# Patient Record
Sex: Female | Born: 1950 | Race: White | Hispanic: No | Marital: Single | Smoking: Former smoker
Health system: Southern US, Community
[De-identification: ages and names within clinical notes are randomized; demographics above are authoritative.]

## PROBLEM LIST (undated history)

## (undated) DIAGNOSIS — E559 Vitamin D deficiency, unspecified: Secondary | ICD-10-CM

## (undated) DIAGNOSIS — T7840XA Allergy, unspecified, initial encounter: Secondary | ICD-10-CM

## (undated) DIAGNOSIS — C801 Malignant (primary) neoplasm, unspecified: Secondary | ICD-10-CM

## (undated) DIAGNOSIS — E119 Type 2 diabetes mellitus without complications: Secondary | ICD-10-CM

## (undated) DIAGNOSIS — I10 Essential (primary) hypertension: Secondary | ICD-10-CM

## (undated) DIAGNOSIS — Z972 Presence of dental prosthetic device (complete) (partial): Secondary | ICD-10-CM

## (undated) DIAGNOSIS — E785 Hyperlipidemia, unspecified: Secondary | ICD-10-CM

## (undated) HISTORY — DX: Allergy, unspecified, initial encounter: T78.40XA

## (undated) HISTORY — DX: Vitamin D deficiency, unspecified: E55.9

## (undated) HISTORY — PX: MOHS SURGERY: SUR867

## (undated) HISTORY — DX: Type 2 diabetes mellitus without complications: E11.9

## (undated) HISTORY — DX: Essential (primary) hypertension: I10

## (undated) HISTORY — PX: OTHER SURGICAL HISTORY: SHX169

## (undated) HISTORY — DX: Malignant (primary) neoplasm, unspecified: C80.1

## (undated) HISTORY — DX: Hyperlipidemia, unspecified: E78.5

---

## 2002-11-17 ENCOUNTER — Other Ambulatory Visit: Admission: RE | Admit: 2002-11-17 | Discharge: 2002-11-17 | Payer: Self-pay | Admitting: Family Medicine

## 2003-12-19 ENCOUNTER — Ambulatory Visit: Payer: Self-pay | Admitting: Family Medicine

## 2003-12-21 ENCOUNTER — Ambulatory Visit: Payer: Self-pay | Admitting: Family Medicine

## 2003-12-21 ENCOUNTER — Other Ambulatory Visit: Admission: RE | Admit: 2003-12-21 | Discharge: 2003-12-21 | Payer: Self-pay | Admitting: Family Medicine

## 2003-12-21 LAB — CONVERTED CEMR LAB: Pap Smear: NORMAL

## 2004-01-04 ENCOUNTER — Ambulatory Visit: Payer: Self-pay | Admitting: Family Medicine

## 2005-01-30 ENCOUNTER — Ambulatory Visit: Payer: Self-pay | Admitting: Family Medicine

## 2005-02-03 ENCOUNTER — Encounter: Payer: Self-pay | Admitting: Family Medicine

## 2005-02-03 ENCOUNTER — Ambulatory Visit: Payer: Self-pay | Admitting: Family Medicine

## 2005-02-03 ENCOUNTER — Other Ambulatory Visit: Admission: RE | Admit: 2005-02-03 | Discharge: 2005-02-03 | Payer: Self-pay | Admitting: Family Medicine

## 2005-03-10 ENCOUNTER — Ambulatory Visit: Payer: Self-pay | Admitting: Family Medicine

## 2005-03-27 ENCOUNTER — Ambulatory Visit: Payer: Self-pay | Admitting: Family Medicine

## 2005-04-02 ENCOUNTER — Ambulatory Visit: Payer: Self-pay | Admitting: Family Medicine

## 2005-05-27 ENCOUNTER — Ambulatory Visit: Payer: Self-pay | Admitting: Internal Medicine

## 2006-02-22 ENCOUNTER — Ambulatory Visit: Payer: Self-pay | Admitting: Family Medicine

## 2006-02-22 LAB — CONVERTED CEMR LAB
ALT: 22 units/L (ref 0–40)
Alkaline Phosphatase: 63 units/L (ref 39–117)
CO2: 30 meq/L (ref 19–32)
Chloride: 104 meq/L (ref 96–112)
Glucose, Bld: 113 mg/dL — ABNORMAL HIGH (ref 70–99)
HDL: 34.2 mg/dL — ABNORMAL LOW (ref >39.0)
LDL Cholesterol: 83 mg/dL (ref 0–99)
Potassium: 3.4 meq/L — ABNORMAL LOW (ref 3.5–5.1)
Sodium: 142 meq/L (ref 135–145)
VLDL: 21 mg/dL (ref 0–40)

## 2006-02-24 ENCOUNTER — Other Ambulatory Visit: Admission: RE | Admit: 2006-02-24 | Discharge: 2006-02-24 | Payer: Self-pay | Admitting: Family Medicine

## 2006-02-24 ENCOUNTER — Ambulatory Visit: Payer: Self-pay | Admitting: Family Medicine

## 2006-02-24 ENCOUNTER — Encounter: Payer: Self-pay | Admitting: Family Medicine

## 2006-03-18 ENCOUNTER — Ambulatory Visit: Payer: Self-pay | Admitting: Family Medicine

## 2006-08-04 ENCOUNTER — Encounter: Payer: Self-pay | Admitting: Family Medicine

## 2006-08-04 DIAGNOSIS — F17200 Nicotine dependence, unspecified, uncomplicated: Secondary | ICD-10-CM | POA: Insufficient documentation

## 2006-08-04 DIAGNOSIS — I10 Essential (primary) hypertension: Secondary | ICD-10-CM | POA: Insufficient documentation

## 2006-08-11 ENCOUNTER — Ambulatory Visit: Payer: Self-pay | Admitting: Family Medicine

## 2006-08-11 DIAGNOSIS — E749 Disorder of carbohydrate metabolism, unspecified: Secondary | ICD-10-CM | POA: Insufficient documentation

## 2006-08-11 LAB — CONVERTED CEMR LAB
ALT: 18 units/L (ref 0–35)
AST: 20 units/L (ref 0–37)
Albumin: 4.1 g/dL (ref 3.5–5.2)
Alkaline Phosphatase: 57 units/L (ref 39–117)
BUN: 17 mg/dL (ref 6–23)
Basophils Absolute: 0 10*3/uL (ref 0.0–0.1)
Calcium: 9.8 mg/dL (ref 8.4–10.5)
Chloride: 106 meq/L (ref 96–112)
Eosinophils Absolute: 0.2 10*3/uL (ref 0.0–0.6)
Eosinophils Relative: 2.4 % (ref 0.0–5.0)
GFR calc Af Amer: 134 mL/min
GFR calc non Af Amer: 111 mL/min
HDL: 35.7 mg/dL — ABNORMAL LOW (ref >39.0)
MCHC: 34.1 g/dL (ref 30.0–36.0)
MCV: 89.7 fL (ref 78.0–100.0)
Microalb Creat Ratio: 6.3 mg/g (ref 0.0–30.0)
Monocytes Relative: 8.6 % (ref 3.0–11.0)
Neutro Abs: 4.8 10*3/uL (ref 1.4–7.7)
Platelets: 201 10*3/uL (ref 150–400)
RBC: 4.75 M/uL (ref 3.87–5.11)
TSH: 1.44 microintl units/mL (ref 0.35–5.50)
Total CHOL/HDL Ratio: 4.5
Triglycerides: 120 mg/dL (ref 0–149)
WBC: 7.8 10*3/uL (ref 4.5–10.5)

## 2006-08-13 ENCOUNTER — Ambulatory Visit: Payer: Self-pay | Admitting: Family Medicine

## 2007-02-11 ENCOUNTER — Telehealth (INDEPENDENT_AMBULATORY_CARE_PROVIDER_SITE_OTHER): Payer: Self-pay | Admitting: *Deleted

## 2007-03-30 ENCOUNTER — Ambulatory Visit: Payer: Self-pay | Admitting: Family Medicine

## 2007-03-30 LAB — CONVERTED CEMR LAB
AST: 21 units/L (ref 0–37)
Albumin: 4.4 g/dL (ref 3.5–5.2)
Basophils Absolute: 0 10*3/uL (ref 0.0–0.1)
Bilirubin, Direct: 0.1 mg/dL (ref 0.0–0.3)
Chloride: 104 meq/L (ref 96–112)
Cholesterol: 129 mg/dL (ref 0–200)
Creatinine,U: 66.6 mg/dL
Eosinophils Absolute: 0.1 10*3/uL (ref 0.0–0.6)
Eosinophils Relative: 1 % (ref 0.0–5.0)
GFR calc Af Amer: 96 mL/min
GFR calc non Af Amer: 79 mL/min
Glucose, Bld: 101 mg/dL — ABNORMAL HIGH (ref 70–99)
HCT: 45.4 % (ref 36.0–46.0)
Lymphocytes Relative: 18.1 % (ref 12.0–46.0)
MCHC: 33.4 g/dL (ref 30.0–36.0)
MCV: 90.8 fL (ref 78.0–100.0)
Microalb Creat Ratio: 3 mg/g (ref 0.0–30.0)
Microalb, Ur: 0.2 mg/dL (ref 0.0–1.9)
Neutro Abs: 8.8 10*3/uL — ABNORMAL HIGH (ref 1.4–7.7)
Neutrophils Relative %: 74.7 % (ref 43.0–77.0)
Platelets: 188 10*3/uL (ref 150–400)
RBC: 5 M/uL (ref 3.87–5.11)
Sodium: 141 meq/L (ref 135–145)
TSH: 1.22 microintl units/mL (ref 0.35–5.50)
Total CHOL/HDL Ratio: 3.2
Triglycerides: 77 mg/dL (ref 0–149)

## 2007-04-27 ENCOUNTER — Other Ambulatory Visit: Admission: RE | Admit: 2007-04-27 | Discharge: 2007-04-27 | Payer: Self-pay | Admitting: Family Medicine

## 2007-04-27 ENCOUNTER — Ambulatory Visit: Payer: Self-pay | Admitting: Family Medicine

## 2007-04-27 ENCOUNTER — Encounter: Payer: Self-pay | Admitting: Family Medicine

## 2007-04-27 LAB — CONVERTED CEMR LAB: Pap Smear: NORMAL

## 2007-05-02 ENCOUNTER — Encounter (INDEPENDENT_AMBULATORY_CARE_PROVIDER_SITE_OTHER): Payer: Self-pay | Admitting: *Deleted

## 2007-05-09 ENCOUNTER — Encounter: Payer: Self-pay | Admitting: Family Medicine

## 2007-06-01 ENCOUNTER — Encounter: Payer: Self-pay | Admitting: Family Medicine

## 2007-06-06 ENCOUNTER — Encounter (INDEPENDENT_AMBULATORY_CARE_PROVIDER_SITE_OTHER): Payer: Self-pay | Admitting: *Deleted

## 2008-04-30 ENCOUNTER — Ambulatory Visit: Payer: Self-pay | Admitting: Family Medicine

## 2008-04-30 LAB — CONVERTED CEMR LAB
ALT: 16 units/L (ref 0–35)
AST: 20 units/L (ref 0–37)
Albumin: 4.4 g/dL (ref 3.5–5.2)
BUN: 13 mg/dL (ref 6–23)
Basophils Absolute: 0 10*3/uL (ref 0.0–0.1)
CO2: 34 meq/L — ABNORMAL HIGH (ref 19–32)
Chloride: 107 meq/L (ref 96–112)
Cholesterol: 142 mg/dL (ref 0–200)
GFR calc non Af Amer: 92.22 mL/min (ref >60)
Glucose, Bld: 104 mg/dL — ABNORMAL HIGH (ref 70–99)
HCT: 43.5 % (ref 36.0–46.0)
Hemoglobin: 15.3 g/dL — ABNORMAL HIGH (ref 12.0–15.0)
Lymphs Abs: 1.8 10*3/uL (ref 0.7–4.0)
MCHC: 35.1 g/dL (ref 30.0–36.0)
MCV: 91 fL (ref 78.0–100.0)
Microalb Creat Ratio: 5.6 mg/g (ref 0.0–30.0)
Microalb, Ur: 0.2 mg/dL (ref 0.0–1.9)
Monocytes Absolute: 0.6 10*3/uL (ref 0.1–1.0)
Monocytes Relative: 8 % (ref 3.0–12.0)
Neutro Abs: 5.1 10*3/uL (ref 1.4–7.7)
Platelets: 172 10*3/uL (ref 150.0–400.0)
Potassium: 4 meq/L (ref 3.5–5.1)
RDW: 12.4 % (ref 11.5–14.6)
Sodium: 145 meq/L (ref 135–145)
TSH: 1.62 microintl units/mL (ref 0.35–5.50)

## 2008-05-02 ENCOUNTER — Other Ambulatory Visit: Admission: RE | Admit: 2008-05-02 | Discharge: 2008-05-02 | Payer: Self-pay | Admitting: Family Medicine

## 2008-05-02 ENCOUNTER — Ambulatory Visit: Payer: Self-pay | Admitting: Family Medicine

## 2008-05-02 ENCOUNTER — Encounter: Payer: Self-pay | Admitting: Family Medicine

## 2008-05-02 LAB — CONVERTED CEMR LAB: Pap Smear: NORMAL

## 2008-05-16 ENCOUNTER — Ambulatory Visit: Payer: Self-pay | Admitting: Family Medicine

## 2008-05-16 ENCOUNTER — Encounter (INDEPENDENT_AMBULATORY_CARE_PROVIDER_SITE_OTHER): Payer: Self-pay | Admitting: *Deleted

## 2008-05-21 ENCOUNTER — Encounter (INDEPENDENT_AMBULATORY_CARE_PROVIDER_SITE_OTHER): Payer: Self-pay | Admitting: *Deleted

## 2008-06-01 ENCOUNTER — Encounter: Payer: Self-pay | Admitting: Family Medicine

## 2008-06-01 LAB — HM MAMMOGRAPHY: HM Mammogram: NORMAL

## 2008-06-06 ENCOUNTER — Encounter (INDEPENDENT_AMBULATORY_CARE_PROVIDER_SITE_OTHER): Payer: Self-pay | Admitting: *Deleted

## 2008-06-13 ENCOUNTER — Ambulatory Visit: Payer: Self-pay | Admitting: Family Medicine

## 2008-06-13 LAB — CONVERTED CEMR LAB
ALT: 18 units/L (ref 0–35)
AST: 23 units/L (ref 0–37)

## 2008-07-26 ENCOUNTER — Ambulatory Visit: Payer: Self-pay | Admitting: Family Medicine

## 2008-07-26 LAB — CONVERTED CEMR LAB
AST: 24 units/L (ref 0–37)
Cholesterol: 158 mg/dL (ref 0–200)
Triglycerides: 84 mg/dL (ref 0.0–149.0)

## 2008-08-02 ENCOUNTER — Ambulatory Visit: Payer: Self-pay | Admitting: Family Medicine

## 2009-05-02 ENCOUNTER — Ambulatory Visit: Payer: Self-pay | Admitting: Family Medicine

## 2009-05-02 LAB — CONVERTED CEMR LAB
ALT: 24 units/L (ref 0–35)
Alkaline Phosphatase: 65 units/L (ref 39–117)
Basophils Relative: 0.6 % (ref 0.0–3.0)
Bilirubin, Direct: 0 mg/dL (ref 0.0–0.3)
Calcium: 9.4 mg/dL (ref 8.4–10.5)
Creatinine, Ser: 0.6 mg/dL (ref 0.4–1.2)
Creatinine,U: 31.8 mg/dL
Eosinophils Absolute: 0.1 10*3/uL (ref 0.0–0.7)
Eosinophils Relative: 2.1 % (ref 0.0–5.0)
GFR calc non Af Amer: 109.77 mL/min (ref >60)
HDL: 35.9 mg/dL — ABNORMAL LOW (ref >39.00)
LDL Cholesterol: 86 mg/dL (ref 0–99)
Lymphocytes Relative: 27.1 % (ref 12.0–46.0)
Monocytes Relative: 7.6 % (ref 3.0–12.0)
Neutrophils Relative %: 62.6 % (ref 43.0–77.0)
RBC: 4.65 M/uL (ref 3.87–5.11)
Total CHOL/HDL Ratio: 4
Total Protein: 6.6 g/dL (ref 6.0–8.3)
Triglycerides: 115 mg/dL (ref 0.0–149.0)
WBC: 6.7 10*3/uL (ref 4.5–10.5)

## 2009-05-07 ENCOUNTER — Other Ambulatory Visit: Admission: RE | Admit: 2009-05-07 | Discharge: 2009-05-07 | Payer: Self-pay | Admitting: Family Medicine

## 2009-05-07 ENCOUNTER — Ambulatory Visit: Payer: Self-pay | Admitting: Family Medicine

## 2009-05-07 LAB — CONVERTED CEMR LAB: Pap Smear: NORMAL

## 2009-05-07 LAB — HM PAP SMEAR

## 2009-05-13 ENCOUNTER — Encounter (INDEPENDENT_AMBULATORY_CARE_PROVIDER_SITE_OTHER): Payer: Self-pay | Admitting: *Deleted

## 2009-05-13 LAB — CONVERTED CEMR LAB: Pap Smear: NEGATIVE

## 2009-05-27 ENCOUNTER — Ambulatory Visit: Payer: Self-pay | Admitting: Family Medicine

## 2009-05-27 ENCOUNTER — Encounter (INDEPENDENT_AMBULATORY_CARE_PROVIDER_SITE_OTHER): Payer: Self-pay | Admitting: *Deleted

## 2009-05-27 LAB — FECAL OCCULT BLOOD, GUAIAC: Fecal Occult Blood: NEGATIVE

## 2009-05-27 LAB — CONVERTED CEMR LAB: OCCULT 3: NEGATIVE

## 2009-06-04 ENCOUNTER — Encounter: Payer: Self-pay | Admitting: Family Medicine

## 2009-06-04 ENCOUNTER — Telehealth: Payer: Self-pay | Admitting: Family Medicine

## 2009-06-05 ENCOUNTER — Encounter (INDEPENDENT_AMBULATORY_CARE_PROVIDER_SITE_OTHER): Payer: Self-pay | Admitting: *Deleted

## 2009-09-10 ENCOUNTER — Encounter (INDEPENDENT_AMBULATORY_CARE_PROVIDER_SITE_OTHER): Payer: Self-pay | Admitting: *Deleted

## 2010-03-04 NOTE — Letter (Signed)
Summary: Results Follow up Letter  [place] at [facility]  [address] [place], [place] [address]   Phone: [phone]  Fax: [phone]    05/13/2009 MRN: [id number]  Amaurie Doom [address] [place], [place]  [address]  Dear Ms. Lampi,  The following are the results of your recent test(s):  Test         Result    Pap Smear:        Normal ___X__  Not Normal _____ Comments: Please repeat in one year ______________________________________________________ Cholesterol: LDL(Bad cholesterol):         Your goal is less than:         HDL (Good cholesterol):       Your goal is more than: Comments:  ______________________________________________________ Mammogram:        Normal _____  Not Normal _____ Comments:  ___________________________________________________________________ Hemoccult:        Normal _____  Not normal _______ Comments:    _____________________________________________________________________ Other Tests:    We routinely do not discuss normal results over the telephone.  If you desire a copy of the results, or you have any questions about this information we can discuss them at your next office visit.   Sincerely,     Laurita Quint, MD

## 2010-03-04 NOTE — Letter (Signed)
Summary: Results Follow up Letter  [place] at [facility]  [address] [place], [place] [address]   Phone: [phone]  Fax: [phone]    06/05/2009 MRN: [id number]  Zahraa Rester [address] [place], [place]  [address]  Dear Ms. Brisky,  The following are the results of your recent test(s):  Test         Result    Pap Smear:        Normal _____  Not Normal _____ Comments: ______________________________________________________ Cholesterol: LDL(Bad cholesterol):         Your goal is less than:         HDL (Good cholesterol):       Your goal is more than: Comments:  ______________________________________________________ Mammogram:        Normal __X___  Not Normal _____ Comments: Please repeat in one year.  ___________________________________________________________________ Hemoccult:        Normal _____  Not normal _______ Comments:    _____________________________________________________________________ Other Tests:    We routinely do not discuss normal results over the telephone.  If you desire a copy of the results, or you have any questions about this information we can discuss them at your next office visit.   Sincerely,     Laurita Quint, MD

## 2010-03-04 NOTE — Letter (Signed)
Summary: Nadara Eaton letter  [place] at [facility]  [address] [place], [place] [address]   Phone: [phone]  Fax: [phone]       09/10/2009 MRN: [id number]  Lakeisha Hansson [address] [place], [place]  [address]  Dear Ms. Loree [facility] [place] announce the retirement of Arta Silence, M.D., from full-time practice at the [facility] office effective August 01, 2009 and his plans of returning part-time.  It is important to Dr. Hetty Ely and to our practice that you understand that [facility] has seven physicians in our office for your health care needs.  We will continue to offer the same exceptional care that you have today.    Dr. Hetty Ely has spoken to many of you about his plans for retirement and returning part-time in the fall.   We will continue to work with you through the transition to schedule appointments for you in the office and meet the high standards that [facility] is committed to.   Again, it is with great pleasure that we share the news that Dr. Hetty Ely will return to [facility] in October of 2011 with a reduced schedule.    If you have any questions, or would like to request an appointment with one of our physicians, please call us at [phone] and press the option for Scheduling an appointment.  We take pleasure in providing you with excellent patient care and look forward to seeing you at your next office visit.  Our [facility] Physicians are:  Tillman Abide, M.D. Laurita Quint, M.D. Roxy Manns, M.D. Kerby Nora, M.D. Hannah Beat, M.D. Ruthe Mannan, M.D. We proudly welcomed Raechel Ache, M.D. and Eustaquio Boyden, M.D. to the practice in July/August 2011.  Sincerely,   [facility]

## 2010-03-04 NOTE — Letter (Signed)
Summary: Results Follow up Letter  [place] at [facility]  [address] [place], [place] [address]   Phone: [phone]  Fax: [phone]    05/27/2009 MRN: [id number]  Donalee Broadnax [address] [place], [place]  [address]  Dear Ms. Branam,  The following are the results of your recent test(s):  Test         Result    Pap Smear:        Normal _____  Not Normal _____ Comments: ______________________________________________________ Cholesterol: LDL(Bad cholesterol):         Your goal is less than:         HDL (Good cholesterol):       Your goal is more than: Comments:  ______________________________________________________ Mammogram:        Normal _____  Not Normal _____ Comments:  ___________________________________________________________________ Hemoccult:        Normal _X____  Not normal _______ Comments: Please repeat in one year.   _____________________________________________________________________ Other Tests:    We routinely do not discuss normal results over the telephone.  If you desire a copy of the results, or you have any questions about this information we can discuss them at your next office visit.   Sincerely,     Laurita Quint, MD

## 2010-03-04 NOTE — Assessment & Plan Note (Signed)
Summary: CPX / LFW   Vital Signs:  Patient profile:   60 year old female Weight:      152.25 pounds BMI:     27.50 Temp:     99.0 degrees F oral Pulse rate:   80 / minute Pulse rhythm:   regular BP sitting:   120 / 76  (left arm) Cuff size:   regular  Vitals Entered By: Sydell Axon LPN (May 07, 1608 8:38 AM) CC: 30 Minute checkup, hemoccult cards given to patient   History of Present Illness: Pt here for Comp Exam...has restarted smoking twice. Is continuing to try....has gained weight.  She feels well except right shoulder is popping and cracking.   Preventive Screening-Counseling & Management  Alcohol-Tobacco     Alcohol drinks/day: 0     Smoking Status: current, but had quit and gained weight.     Smoking Cessation Counseling: no     Packs/Day: <0.25     Year Started: 1974     Year Quit: Quit and restarted x 2 lately.     Passive Smoke Exposure: no  Caffeine-Diet-Exercise     Caffeine use/day: 3     Does Patient Exercise: yes     Type of exercise: walks, yardwork, ex machine     Times/week: 4  Problems Prior to Update: 1)  Special Screening Malig Neoplasms Other Sites  (ICD-V76.49) 2)  Iliotibial Band Syndrome Right  (ICD-728.89) 3)  Joint Crepitus, Knee, Right  (ICD-719.66) 4)  Other Screening Mammogram  (ICD-V76.12) 5)  Health Maintenance Exam  (ICD-V70.0) 6)  Disorder, Carbohydrate Metabolism Nos  (ICD-271.9) 7)  Hyperglycemia, 100  (ICD-790.6) 8)  Nicotine Addiction  (ICD-305.1) 9)  Hypercholesterolemia, 313/ Ldl 262 / Hdl 22  (ICD-272.0) 10)  Hypertension  (ICD-401.9)  Medications Prior to Update: 1)  Simvastatin 80 Mg Tabs (Simvastatin) .... One Tab At Night 2)  Maxzide-25 37.5-25 Mg Tabs (Triamterene-Hctz) .... Take 1 Tablet By Mouth Once A Day 3)  Potassium Chloride Cr 10 Meq Tbcr (Potassium Chloride) .... Take 1 Tablet By Mouth Every Morning 4)  One-Daily Multivitamins   Tabs (Multiple Vitamin) .... Take One By Mouth Daily  Allergies: 1)  !  Polysporin  Past History:  Past Medical History: Last updated: 08/04/2006 Hypertension  Past Surgical History: Last updated: 08/04/2006 NSVD x 3 Last 1995  Family History: Last updated: 2007-05-05 Father: Died at age 6 of heart attack Mother: Died at age 41 of breast cancer with hypertension Siblings: One brother who died of diabetes and heart attack at the age of 10, one who died from a motor vehicle accident at 40 and one dec Motorcycle accdt age of 80 CV, Father Mi, Brother MI HBP, Mother DM, Brother, MGM Depression father ETOH, father  Social History: Last updated: 05/02/2008 Marital Status: Divorced, living with partner of 15 years. Children: Daughter  Occupation: Dispensing optician at [employer]  Risk Factors: Alcohol Use: 0 (05/07/2009) Caffeine Use: 3 (05/07/2009) Exercise: yes (05/07/2009)  Risk Factors: Smoking Status: current, but had quit and gained weight. (05/07/2009) Packs/Day: <0.25 (05/07/2009) Passive Smoke Exposure: no (05/07/2009)  Social History: Packs/Day:  <0.25  Review of Systems General:  Denies chills, fatigue, fever, sweats, weakness, and weight loss. Eyes:  Denies blurring, discharge, and eye pain; saw eye doctore for crusting in eye and given antibiotic. ENT:  Denies decreased hearing, earache, and ringing in ears. CV:  Complains of swelling of hands; denies chest pain or discomfort, fainting, fatigue, near fainting, palpitations, shortness of breath  with exertion, and swelling of feet; rare. Resp:  Denies cough, shortness of breath, and wheezing. GI:  Complains of excessive appetite; denies abdominal pain, bloody stools, change in bowel habits, constipation, dark tarry stools, diarrhea, indigestion, loss of appetite, nausea, vomiting, vomiting blood, and yellowish skin color; with stopping smoking. GU:  Denies dysuria, nocturia, and urinary frequency. MS:  Complains of joint pain; denies low back pain, muscle aches, cramps, and  stiffness; shoulder crepitus and occas knee probs.. Derm:  Complains of dryness; denies itching and rash; legs. Neuro:  Denies memory loss, numbness, poor balance, tingling, and tremors.  Physical Exam  General:  Well-developed,well-nourished,in no acute distress; alert,appropriate and cooperative throughout examination Head:  Normocephalic and atraumatic without obvious abnormalities. No apparent alopecia or  Sinuses NT. Eyes:  Conjunctiva clear bilaterally.  Ears:  External ear exam shows no significant lesions or deformities.  Otoscopic examination reveals clear canals, tympanic membranes are intact bilaterally without bulging, retraction, inflammation or discharge. Hearing is grossly normal bilaterally. Nose:  External nasal examination shows no deformity or inflammation. Nasal mucosa are pink and moist without lesions or exudates. Mouth:  Oral mucosa and oropharynx without lesions or exudates.  Teeth in good repair. Neck:  No deformities, masses, or tenderness noted. Chest Wall:  No deformities, masses, or tenderness noted. Breasts:  No mass, nodules, thickening, tenderness, bulging, retraction, inflamation, nipple discharge or skin changes noted.   Lungs:  Normal respiratory effort, chest expands symmetrically. Lungs are clear to auscultation, no crackles or wheezes. Heart:  Normal rate and regular rhythm. S1 and S2 normal without gallop, murmur, click, rub or other extra sounds. Abdomen:  Bowel sounds positive,abdomen soft and non-tender without masses, organomegaly or hernias noted. Rectal:  No external abnormalities noted. Normal sphincter tone. No rectal masses or tenderness. Gneg. Genitalia:  Pelvic Exam:        External: normal female genitalia without lesions or masses        Vagina: normal without lesions or masses        Cervix: normal without lesions or masses        Adnexa: normal bimanual exam without masses or fullness        Uterus: normal by palpation        Pap smear:  performed Msk:  No deformity or scoliosis noted of thoracic or lumbar spine.   Pulses:  R and L carotid,radial,femoral,dorsalis pedis and posterior tibial pulses are full and equal bilaterally Extremities:  No clubbing, cyanosis, edema, or deformity noted with normal full range of motion of all joints.   Neurologic:  No cranial nerve deficits noted. Station and gait are normal. Sensory, motor and coordinative functions appear intact. Skin:  Intact without suspicious lesions or rashes Cervical Nodes:  No lymphadenopathy noted Axillary Nodes:  No palpable lymphadenopathy Inguinal Nodes:  No significant adenopathy Psych:  Cognition and judgment appear intact. Alert and cooperative with normal attention span and concentration. No apparent delusions, illusions, hallucinations   Impression & Recommendations:  Problem # 1:  HEALTH MAINTENANCE EXAM (ICD-V70.0) Assessment Comment Only Discussed immun, she is UTD. Needs mammo...last this time last year....scheduled 5 May.  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 Cont to watch sweets and carb intake.  Problem # 3:  NICOTINE ADDICTION (ICD-305.1) Assessment: Deteriorated Is back smoking some. Is able to stop fairly easily for a time...discussed goal to permanently quit. Diet ans exercise altho not easy are the way to watch diet.  Problem # 4:  HYPERCHOLESTEROLEMIA, 313/ LDL 262 / HDL 22 (ICD-272.0) Assessment: Unchanged Decent nos. Try to exercise more to elevate HDL.  Her updated medication list for this problem includes:    Simvastatin 80 Mg Tabs (Simvastatin) ..... One tab at night  Labs Reviewed: SGOT: 22 (05/02/2009)   SGPT: 24 (05/02/2009)   HDL:35.90 (05/02/2009), 38.30 (07/26/2008)  LDL:86 (05/02/2009), 103 (36/64/4034)  Chol:145 (05/02/2009), 158 (07/26/2008)  Trig:115.0 (05/02/2009), 84.0 (07/26/2008)  Problem # 5:  HYPERTENSION (ICD-401.9) Assessment:  Improved Better than last time. Looks good, cont curr meds. Her updated medication list for this problem includes:    Maxzide-25 37.5-25 Mg Tabs (Triamterene-hctz) .Marland Kitchen... Take 1 tablet by mouth once a day  BP today: 120/76 Prior BP: 140/60 (08/02/2008)  Labs Reviewed: K+: 3.6 (05/02/2009) Creat: : 0.6 (05/02/2009)   Chol: 145 (05/02/2009)   HDL: 35.90 (05/02/2009)   LDL: 86 (05/02/2009)   TG: 115.0 (05/02/2009)  Complete Medication List: 1)  Simvastatin 80 Mg Tabs (Simvastatin) .... One tab at night 2)  Maxzide-25 37.5-25 Mg Tabs (Triamterene-hctz) .... Take 1 tablet by mouth once a day 3)  Potassium Chloride Cr 10 Meq Tbcr (Potassium chloride) .... Take 1 tablet by mouth every morning 4)  One-daily Multivitamins Tabs (Multiple vitamin) .... Take one by mouth daily   Patient Instructions: 1)  RTC next year for repeat exam.  Current Allergies (reviewed today): ! POLYSPORIN

## 2010-03-04 NOTE — Progress Notes (Signed)
Summary: needs order for mammogram  Phone Note From Other Clinic   Caller: [facility]  [phone] Summary of Call: [facility] for her annual mammogram and they dont have an order.  They are going to do the mammogram but we need to send the order. Initial call taken by: Lowella Petties CMA,  Jun 04, 2009 8:47 AM  Follow-up for Phone Call        Order faxed. Follow-up by: Carlton Adam,  Jun 04, 2009 3:48 PM

## 2010-03-25 ENCOUNTER — Encounter: Payer: Self-pay | Admitting: Family Medicine

## 2010-03-25 DIAGNOSIS — I10 Essential (primary) hypertension: Secondary | ICD-10-CM

## 2010-05-08 ENCOUNTER — Other Ambulatory Visit: Payer: Self-pay | Admitting: Family Medicine

## 2010-05-08 DIAGNOSIS — F17200 Nicotine dependence, unspecified, uncomplicated: Secondary | ICD-10-CM

## 2010-05-08 DIAGNOSIS — I10 Essential (primary) hypertension: Secondary | ICD-10-CM

## 2010-05-08 DIAGNOSIS — E786 Lipoprotein deficiency: Secondary | ICD-10-CM

## 2010-05-08 DIAGNOSIS — E749 Disorder of carbohydrate metabolism, unspecified: Secondary | ICD-10-CM

## 2010-05-12 ENCOUNTER — Ambulatory Visit (INDEPENDENT_AMBULATORY_CARE_PROVIDER_SITE_OTHER): Payer: BC Managed Care – PPO | Admitting: Family Medicine

## 2010-05-12 ENCOUNTER — Encounter: Payer: Self-pay | Admitting: Family Medicine

## 2010-05-12 ENCOUNTER — Other Ambulatory Visit (INDEPENDENT_AMBULATORY_CARE_PROVIDER_SITE_OTHER): Payer: BC Managed Care – PPO | Admitting: Family Medicine

## 2010-05-12 DIAGNOSIS — Z0000 Encounter for general adult medical examination without abnormal findings: Secondary | ICD-10-CM

## 2010-05-12 DIAGNOSIS — F17200 Nicotine dependence, unspecified, uncomplicated: Secondary | ICD-10-CM

## 2010-05-12 DIAGNOSIS — Z1211 Encounter for screening for malignant neoplasm of colon: Secondary | ICD-10-CM | POA: Insufficient documentation

## 2010-05-12 DIAGNOSIS — E7800 Pure hypercholesterolemia, unspecified: Secondary | ICD-10-CM

## 2010-05-12 DIAGNOSIS — I10 Essential (primary) hypertension: Secondary | ICD-10-CM

## 2010-05-12 DIAGNOSIS — E749 Disorder of carbohydrate metabolism, unspecified: Secondary | ICD-10-CM

## 2010-05-12 DIAGNOSIS — E786 Lipoprotein deficiency: Secondary | ICD-10-CM

## 2010-05-12 LAB — HEPATIC FUNCTION PANEL
Alkaline Phosphatase: 59 U/L (ref 39–117)
Bilirubin, Direct: 0.1 mg/dL (ref 0.0–0.3)
Total Protein: 6.4 g/dL (ref 6.0–8.3)

## 2010-05-12 LAB — BASIC METABOLIC PANEL
CO2: 29 mEq/L (ref 19–32)
Calcium: 9.4 mg/dL (ref 8.4–10.5)
Creatinine, Ser: 0.8 mg/dL (ref 0.4–1.2)
GFR: 84.54 mL/min (ref >60.00)
Sodium: 140 mEq/L (ref 135–145)

## 2010-05-12 LAB — CBC WITH DIFFERENTIAL/PLATELET
Basophils Absolute: 0 10*3/uL (ref 0.0–0.1)
Basophils Relative: 0.5 % (ref 0.0–3.0)
HCT: 42.4 % (ref 36.0–46.0)
Hemoglobin: 14.7 g/dL (ref 12.0–15.0)
Lymphs Abs: 1.8 10*3/uL (ref 0.7–4.0)
MCHC: 34.7 g/dL (ref 30.0–36.0)
Monocytes Relative: 9.5 % (ref 3.0–12.0)
Neutro Abs: 4.8 10*3/uL (ref 1.4–7.7)
RDW: 13.8 % (ref 11.5–14.6)

## 2010-05-12 LAB — LIPID PANEL
HDL: 39.3 mg/dL (ref >39.00)
Total CHOL/HDL Ratio: 5
Triglycerides: 112 mg/dL (ref 0.0–149.0)
VLDL: 22.4 mg/dL (ref 0.0–40.0)

## 2010-05-12 LAB — MICROALBUMIN / CREATININE URINE RATIO: Microalb Creat Ratio: 0.5 mg/g (ref 0.0–30.0)

## 2010-05-12 NOTE — Assessment & Plan Note (Signed)
D/w pt re: meds/diet/weight.  See notes on labs.

## 2010-05-12 NOTE — Assessment & Plan Note (Signed)
D/w pt.  She isn't ready to quit yet but is getting there.  She'll let me know when she needs help.

## 2010-05-12 NOTE — Assessment & Plan Note (Signed)
See pending labs.  No change in meds today.  D/w pt about exercise and diet.

## 2010-05-12 NOTE — Assessment & Plan Note (Addendum)
Healthy habits d/w pt.  Flu shot at work.  Tdap up to date.  She'll call about mammogram.  Pap not indicated today.  D/w pt about smoking/diet/exercise.

## 2010-05-12 NOTE — Patient Instructions (Signed)
Work on stopping smoking.  Keep exercising and working on your diet. You can get your results through our phone system.  Follow the instructions on the blue card.  I'll send your meds to [facility] after I see your labs.  Take care.

## 2010-05-12 NOTE — Progress Notes (Addendum)
CPE- See plan.  Routine anticipatory guidance given to patient.  See health maintenance.  Hypertension:    Using medication without problems or lightheadedness: yes Chest pain with exertion:no Edema:no Short of breath:no Average home BPs: not checked Other issues:no Walking for exercise.    Elevated Cholesterol: Using medications without problems:yes Muscle aches: no Other complaints:no  PMH and SH reviewed  Meds, vitals, and allergies reviewed.   ROS: See HPI.  Otherwise negative.    GEN: nad, alert and oriented HEENT: mucous membranes moist NECK: supple w/o LA CV: rrr. PULM: ctab, no inc wob ABD: soft, +bs EXT: no edema SKIN: no acute rash Breast exam: No mass, nodules, thickening, tenderness, bulging, retraction, inflamation, nipple discharge or skin changes noted.  No axillary or clavicular LA.  Chaperoned exam.

## 2010-05-12 NOTE — Assessment & Plan Note (Signed)
D/w patient re:options for colon cancer screening, including IFOB vs. colonoscopy.  Risks and benefits of both were discussed and patient voiced understanding.  Pt elects for:IFOB  

## 2010-05-14 ENCOUNTER — Telehealth: Payer: Self-pay | Admitting: *Deleted

## 2010-05-14 MED ORDER — SIMVASTATIN 40 MG PO TABS: 40.0000 mg | ORAL_TABLET | Freq: Every day | ORAL | Status: DC

## 2010-05-14 MED ORDER — TRIAMTERENE-HCTZ 37.5-25 MG PO TABS: 1.0000 | ORAL_TABLET | Freq: Every day | ORAL | Status: DC

## 2010-05-14 MED ORDER — POTASSIUM CHLORIDE 10 MEQ PO TBCR: 10.0000 meq | EXTENDED_RELEASE_TABLET | Freq: Every day | ORAL | Status: DC

## 2010-05-14 NOTE — Telephone Encounter (Signed)
Left message on VM at work, personalized VM.

## 2010-05-14 NOTE — Progress Notes (Signed)
Addended by: Raechel Ache on: 05/14/2010 12:37 PM   Modules accepted: Orders

## 2010-05-14 NOTE — Telephone Encounter (Signed)
Message copied by Delilah Shan on Wed May 14, 2010  2:29 PM ------      Message from: Raechel Ache      Created: Wed May 14, 2010 12:38 PM       Please call pt.  Labs are okay except for mild inc in glucose. I wouldn't change meds except for the simvastatin, which was decreased to 40mg  a day.  I sent the meds to [facility] pharmacy.  I don't have the actual labs to sign off yet, because they may have been routed to [facility].  Rec cont meds and continue to work on healthy diet/exercise.  Thanks.             ----- Message -----         From: Joaquim Nam, MD         Sent: 05/12/2010   9:15 AM           To: Joaquim Nam, MD            Check labs and then send the rxs to [facility] (check doses- dec the simvastatin).

## 2010-05-27 ENCOUNTER — Other Ambulatory Visit: Payer: BC Managed Care – PPO

## 2010-05-27 ENCOUNTER — Other Ambulatory Visit (INDEPENDENT_AMBULATORY_CARE_PROVIDER_SITE_OTHER): Payer: BC Managed Care – PPO | Admitting: Family Medicine

## 2010-05-27 DIAGNOSIS — Z1211 Encounter for screening for malignant neoplasm of colon: Secondary | ICD-10-CM

## 2010-05-28 ENCOUNTER — Encounter: Payer: Self-pay | Admitting: *Deleted

## 2010-07-07 ENCOUNTER — Encounter: Payer: Self-pay | Admitting: Family Medicine

## 2011-05-13 ENCOUNTER — Telehealth: Payer: Self-pay | Admitting: Family Medicine

## 2011-05-13 DIAGNOSIS — I10 Essential (primary) hypertension: Secondary | ICD-10-CM

## 2011-05-13 NOTE — Telephone Encounter (Signed)
Orders only

## 2011-05-14 ENCOUNTER — Encounter: Payer: Self-pay | Admitting: *Deleted

## 2011-05-14 ENCOUNTER — Ambulatory Visit (INDEPENDENT_AMBULATORY_CARE_PROVIDER_SITE_OTHER): Payer: BC Managed Care – PPO | Admitting: Family Medicine

## 2011-05-14 ENCOUNTER — Encounter: Payer: Self-pay | Admitting: Family Medicine

## 2011-05-14 ENCOUNTER — Other Ambulatory Visit (INDEPENDENT_AMBULATORY_CARE_PROVIDER_SITE_OTHER): Payer: BC Managed Care – PPO

## 2011-05-14 VITALS — BP 126/68 | HR 65 | Temp 98.1°F | Wt 156.8 lb

## 2011-05-14 DIAGNOSIS — I10 Essential (primary) hypertension: Secondary | ICD-10-CM

## 2011-05-14 DIAGNOSIS — Z1211 Encounter for screening for malignant neoplasm of colon: Secondary | ICD-10-CM

## 2011-05-14 DIAGNOSIS — Z0000 Encounter for general adult medical examination without abnormal findings: Secondary | ICD-10-CM

## 2011-05-14 DIAGNOSIS — F17200 Nicotine dependence, unspecified, uncomplicated: Secondary | ICD-10-CM

## 2011-05-14 DIAGNOSIS — E7800 Pure hypercholesterolemia, unspecified: Secondary | ICD-10-CM

## 2011-05-14 LAB — COMPREHENSIVE METABOLIC PANEL
ALT: 30 U/L (ref 0–35)
CO2: 29 mEq/L (ref 19–32)
Chloride: 104 mEq/L (ref 96–112)
GFR: 101.16 mL/min (ref >60.00)
Sodium: 142 mEq/L (ref 135–145)
Total Bilirubin: 0.4 mg/dL (ref 0.3–1.2)
Total Protein: 7.1 g/dL (ref 6.0–8.3)

## 2011-05-14 LAB — LIPID PANEL
HDL: 40.1 mg/dL (ref >39.00)
Total CHOL/HDL Ratio: 5

## 2011-05-14 MED ORDER — TRIAMTERENE-HCTZ 37.5-25 MG PO TABS: 1.0000 | ORAL_TABLET | Freq: Every day | ORAL | Status: DC

## 2011-05-14 MED ORDER — SIMVASTATIN 40 MG PO TABS: 40.0000 mg | ORAL_TABLET | Freq: Every day | ORAL | Status: DC

## 2011-05-14 MED ORDER — POTASSIUM CHLORIDE ER 10 MEQ PO TBCR: 10.0000 meq | EXTENDED_RELEASE_TABLET | Freq: Every day | ORAL | Status: DC

## 2011-05-14 NOTE — Assessment & Plan Note (Signed)
Routine anticipatory guidance given to patient. See health maintenance.  She's working on stopping smoking, using the E cigarette.  Td due in 2014 Flu shot done at work Colon cancer screening.  D/w patient [id number] for colon cancer screening, including IFOB vs. colonoscopy.  Risks and benefits of both were discussed and patient voiced understanding.  Pt elects for: IFOB.   Mammogram due late 5/13.  She'll call about that.   Pap smear.  Last done 2011.  Due 2014.   She has a living will.

## 2011-05-14 NOTE — Patient Instructions (Addendum)
Go the lab on the way out.   Call about the mammogram and let us know if you need extra papers/orders.   We'll contact you with your lab report. Take care.  Glad to see you.

## 2011-05-14 NOTE — Progress Notes (Signed)
Addended by: Lars Mage on: 05/14/2011 02:09 PM   Modules accepted: Orders

## 2011-05-14 NOTE — Progress Notes (Signed)
CPE- See plan. Routine anticipatory guidance given to patient. See health maintenance.  She's working on stopping smoking, using the E cigarette.  Td due in 2014 Flu shot done at work Colon cancer screening.  D/w patient [id number] for colon cancer screening, including IFOB vs. colonoscopy.  Risks and benefits of both were discussed and patient voiced understanding.  Pt elects for: IFOB.   Mammogram due late 5/13.  She'll call about that.   Pap smear.  Last done 2011.  Due 2014.   She has a living will.   Hypertension:  Using medication without problems or lightheadedness: yes  Chest pain with exertion:no  Edema:no  Short of breath:no  Average home BPs: occ, similar to today.   Other issues:no  Walking for exercise prev, she'll start back soon.  Las are pending.    Elevated Cholesterol:  Using medications without problems:yes  Muscle aches: no  Other complaints:no   PMH and SH reviewed   Meds, vitals, and allergies reviewed.   ROS: See HPI. Otherwise negative.   GEN: nad, alert and oriented  HEENT: mucous membranes moist  NECK: supple w/o LA  CV: rrr.  PULM: ctab, no inc wob  ABD: soft, +bs  EXT: no edema  SKIN: no acute rash  Breast exam: No mass, nodules, thickening, tenderness, bulging, retraction, inflamation, nipple discharge or skin changes noted. No axillary or clavicular LA. Chaperoned exam.

## 2011-05-14 NOTE — Assessment & Plan Note (Signed)
Cont current meds, labs pending.  D/w pt about lifestyle interventions.   

## 2011-05-14 NOTE — Assessment & Plan Note (Signed)
She's working on cessation.  D/w pt.

## 2011-05-14 NOTE — Assessment & Plan Note (Signed)
Cont current meds, labs pending.  D/w pt about lifestyle interventions.

## 2011-05-19 ENCOUNTER — Encounter: Payer: BC Managed Care – PPO | Admitting: Family Medicine

## 2011-05-20 ENCOUNTER — Other Ambulatory Visit: Payer: BC Managed Care – PPO

## 2011-05-20 DIAGNOSIS — Z1211 Encounter for screening for malignant neoplasm of colon: Secondary | ICD-10-CM

## 2011-05-21 ENCOUNTER — Encounter: Payer: Self-pay | Admitting: *Deleted

## 2011-05-21 DIAGNOSIS — Z1289 Encounter for screening for malignant neoplasm of other sites: Secondary | ICD-10-CM

## 2011-06-22 ENCOUNTER — Encounter: Payer: Self-pay | Admitting: *Deleted

## 2011-06-22 ENCOUNTER — Encounter: Payer: Self-pay | Admitting: Family Medicine

## 2012-06-03 ENCOUNTER — Encounter: Payer: Self-pay | Admitting: Family Medicine

## 2012-06-03 ENCOUNTER — Ambulatory Visit (INDEPENDENT_AMBULATORY_CARE_PROVIDER_SITE_OTHER): Payer: BC Managed Care – PPO | Admitting: Family Medicine

## 2012-06-03 VITALS — BP 140/70 | HR 69 | Temp 98.4°F | Ht 63.0 in | Wt 157.5 lb

## 2012-06-03 DIAGNOSIS — Z23 Encounter for immunization: Secondary | ICD-10-CM

## 2012-06-03 DIAGNOSIS — F17200 Nicotine dependence, unspecified, uncomplicated: Secondary | ICD-10-CM

## 2012-06-03 DIAGNOSIS — Z1211 Encounter for screening for malignant neoplasm of colon: Secondary | ICD-10-CM

## 2012-06-03 DIAGNOSIS — I10 Essential (primary) hypertension: Secondary | ICD-10-CM

## 2012-06-03 DIAGNOSIS — Z1231 Encounter for screening mammogram for malignant neoplasm of breast: Secondary | ICD-10-CM

## 2012-06-03 DIAGNOSIS — E7800 Pure hypercholesterolemia, unspecified: Secondary | ICD-10-CM

## 2012-06-03 DIAGNOSIS — Z0000 Encounter for general adult medical examination without abnormal findings: Secondary | ICD-10-CM

## 2012-06-03 MED ORDER — SIMVASTATIN 40 MG PO TABS: 40.0000 mg | ORAL_TABLET | Freq: Every day | ORAL | Status: DC

## 2012-06-03 MED ORDER — POTASSIUM CHLORIDE ER 10 MEQ PO TBCR: 10.0000 meq | EXTENDED_RELEASE_TABLET | Freq: Every day | ORAL | Status: DC

## 2012-06-03 MED ORDER — TRIAMTERENE-HCTZ 37.5-25 MG PO TABS: 1.0000 | ORAL_TABLET | Freq: Every day | ORAL | Status: DC

## 2012-06-03 NOTE — Assessment & Plan Note (Signed)
Encouraged taper and then cessation. 

## 2012-06-03 NOTE — Patient Instructions (Addendum)
Go to the lab on the way out.  We'll contact you with your lab report. See Shirlee Limerick about your referral before you leave today.  Check with your insurance to see if they will cover the shingles shot after you turn 62 (see if it is cheaper here or at the pharmacy). I would get a flu shot each fall.   Take care.  Glad to see you.

## 2012-06-03 NOTE — Assessment & Plan Note (Signed)
Routine anticipatory guidance given to patient.  See health maintenance. D/w pt about smoking. She is due to become a grandmother in June.  Tetanus due Flu shot done yearly Shingles shot discussed, due at 62.   Mammogram due for later in the month.   DXA not due yet.  Pap smear last done in 2011. Never had an abnormal. D/w pt about options.  No discharge.  We opted to defer until 2015.   Breast exam done today.  D/w patient [facility] for colon cancer screening, including IFOB vs. colonoscopy.  Risks and benefits of both were discussed and patient voiced understanding.  Pt elects for: IFOB.  Occ/rare stress incontinence. She has started Kegel exercises.   She has a living will.  [name] is designated if incapacitated.

## 2012-06-03 NOTE — Assessment & Plan Note (Signed)
Controlled, continue current meds.  See notes on labs.  D/w pt about diet and exercise.

## 2012-06-03 NOTE — Assessment & Plan Note (Signed)
Continue current meds.  See notes on labs.  D/w pt about diet and exercise.

## 2012-06-03 NOTE — Progress Notes (Signed)
CPE- See plan.  Routine anticipatory guidance given to patient.  See health maintenance. D/w pt about smoking. She is due to become a grandmother in June.  Tetanus due Flu shot done yearly Shingles shot discussed, due at 62.   Mammogram due for later in the month.   DXA not due yet.  Pap smear last done in 2011. Never had an abnormal. D/w pt about options.  No discharge.  We opted to defer until 2015.   Breast exam done today.  D/w patient [id number] for colon cancer screening, including IFOB vs. colonoscopy.  Risks and benefits of both were discussed and patient voiced understanding.  Pt elects for: IFOB.  Occ/rare stress incontinence. She has started Kegel exercises.   She has a living will.  [name] is designated if incapacitated.   Hypertension:    Using medication without problems or lightheadedness: yes Chest pain with exertion:no Edema:no Short of breath:no  Elevated Cholesterol: Using medications without problems:yes Muscle aches: no Diet compliance:yes Exercise:yes  PMH and SH reviewed  Meds, vitals, and allergies reviewed.   ROS: See HPI.  Otherwise negative.    GEN: nad, alert and oriented HEENT: mucous membranes moist NECK: supple w/o LA CV: rrr. PULM: ctab, no inc wob ABD: soft, +bs EXT: no edema SKIN: no acute rash Breast exam: No mass, nodules, thickening, tenderness, bulging, retraction, inflamation, nipple discharge or skin changes noted.  No axillary or clavicular LA.  Chaperoned exam.

## 2012-06-06 LAB — COMPREHENSIVE METABOLIC PANEL
ALT: 20 U/L (ref 0–35)
CO2: 31 mEq/L (ref 19–32)
Calcium: 9.1 mg/dL (ref 8.4–10.5)
Chloride: 101 mEq/L (ref 96–112)
Creatinine, Ser: 0.6 mg/dL (ref 0.4–1.2)
GFR: 100.79 mL/min (ref >60.00)
Glucose, Bld: 103 mg/dL — ABNORMAL HIGH (ref 70–99)
Total Bilirubin: 0.6 mg/dL (ref 0.3–1.2)

## 2012-06-06 LAB — LIPID PANEL
Cholesterol: 194 mg/dL (ref 0–200)
HDL: 35 mg/dL — ABNORMAL LOW (ref >39.00)
VLDL: 29.6 mg/dL (ref 0.0–40.0)

## 2012-06-09 ENCOUNTER — Encounter: Payer: Self-pay | Admitting: *Deleted

## 2012-06-16 ENCOUNTER — Other Ambulatory Visit (INDEPENDENT_AMBULATORY_CARE_PROVIDER_SITE_OTHER): Payer: BC Managed Care – PPO

## 2012-06-16 DIAGNOSIS — Z1211 Encounter for screening for malignant neoplasm of colon: Secondary | ICD-10-CM

## 2012-06-16 LAB — FECAL OCCULT BLOOD, IMMUNOCHEMICAL: Fecal Occult Bld: NEGATIVE

## 2012-06-17 ENCOUNTER — Encounter: Payer: Self-pay | Admitting: *Deleted

## 2012-06-24 ENCOUNTER — Encounter: Payer: Self-pay | Admitting: Family Medicine

## 2012-06-24 ENCOUNTER — Encounter: Payer: Self-pay | Admitting: *Deleted

## 2012-07-01 ENCOUNTER — Encounter: Payer: Self-pay | Admitting: Family Medicine

## 2012-07-05 ENCOUNTER — Encounter: Payer: Self-pay | Admitting: *Deleted

## 2013-02-27 ENCOUNTER — Encounter: Payer: Self-pay | Admitting: Family Medicine

## 2013-02-27 ENCOUNTER — Ambulatory Visit (INDEPENDENT_AMBULATORY_CARE_PROVIDER_SITE_OTHER): Payer: BC Managed Care – PPO | Admitting: Family Medicine

## 2013-02-27 VITALS — BP 142/76 | HR 86 | Temp 98.7°F | Wt 159.2 lb

## 2013-02-27 DIAGNOSIS — IMO0002 Reserved for concepts with insufficient information to code with codable children: Secondary | ICD-10-CM

## 2013-02-27 MED ORDER — CEPHALEXIN 500 MG PO CAPS: 500.0000 mg | ORAL_CAPSULE | Freq: Four times a day (QID) | ORAL | Status: DC

## 2013-02-27 NOTE — Patient Instructions (Signed)
Soak your foot a few times today in warm salt water.   Start the antibiotics.  Elevate your foot as much as possible.

## 2013-02-27 NOTE — Progress Notes (Signed)
Pre-visit discussion using our clinic review tool. No additional management support is needed unless otherwise documented below in the visit note.  R 1st nail- lateral soreness and puss collection. Soaking the foot.  Not painful if not walking.  Going on for about 4 days.    Meds, vitals, and allergies reviewed.   ROS: See HPI.  Otherwise, noncontributory.  nad R 1st nail- lateral soreness/erythema and puss collection.  Leading edge of nail not ingrown.

## 2013-03-01 DIAGNOSIS — IMO0002 Reserved for concepts with insufficient information to code with codable children: Secondary | ICD-10-CM | POA: Insufficient documentation

## 2013-03-01 NOTE — Assessment & Plan Note (Signed)
Acute, d/w pt about options.  Verbal informed consent.  Cleaned with etoh.  Pus collection pricked with sterile 21g needle after numbing with ethyl chloride.  She didn't feel needle stick.  Pus expressed.   Start abx, soak foot, elevate foot when possible.  Fu prn.  She agrees.  We didn't need to resect the nail and the leading edge isn't ingrown.

## 2013-03-07 ENCOUNTER — Telehealth: Payer: Self-pay | Admitting: Family Medicine

## 2013-03-07 NOTE — Telephone Encounter (Signed)
Relevant patient education assigned to patient using Emmi. ° °

## 2013-03-13 ENCOUNTER — Other Ambulatory Visit: Payer: Self-pay | Admitting: *Deleted

## 2013-03-13 MED ORDER — POTASSIUM CHLORIDE ER 10 MEQ PO TBCR: 10.0000 meq | EXTENDED_RELEASE_TABLET | Freq: Every day | ORAL | Status: DC

## 2013-03-13 NOTE — Telephone Encounter (Signed)
Sent!

## 2013-03-13 NOTE — Telephone Encounter (Signed)
Received faxed refill request from pharmacy. See medication warning. Is it okay to refill medication? 

## 2013-06-05 ENCOUNTER — Telehealth: Payer: Self-pay | Admitting: Family Medicine

## 2013-06-05 ENCOUNTER — Encounter: Payer: Self-pay | Admitting: Family Medicine

## 2013-06-05 ENCOUNTER — Other Ambulatory Visit (HOSPITAL_COMMUNITY)
Admission: RE | Admit: 2013-06-05 | Discharge: 2013-06-05 | Disposition: A | Discharge status: Home / Self Care | Payer: BC Managed Care – PPO | Source: Ambulatory Visit | Attending: Family Medicine | Admitting: Family Medicine

## 2013-06-05 ENCOUNTER — Ambulatory Visit (INDEPENDENT_AMBULATORY_CARE_PROVIDER_SITE_OTHER): Payer: BC Managed Care – PPO | Admitting: Family Medicine

## 2013-06-05 VITALS — BP 130/70 | HR 74 | Temp 98.4°F | Ht 64.0 in | Wt 150.0 lb

## 2013-06-05 DIAGNOSIS — E7800 Pure hypercholesterolemia, unspecified: Secondary | ICD-10-CM

## 2013-06-05 DIAGNOSIS — Z1151 Encounter for screening for human papillomavirus (HPV): Secondary | ICD-10-CM | POA: Insufficient documentation

## 2013-06-05 DIAGNOSIS — Z1211 Encounter for screening for malignant neoplasm of colon: Secondary | ICD-10-CM

## 2013-06-05 DIAGNOSIS — I10 Essential (primary) hypertension: Secondary | ICD-10-CM

## 2013-06-05 DIAGNOSIS — Z0000 Encounter for general adult medical examination without abnormal findings: Secondary | ICD-10-CM

## 2013-06-05 DIAGNOSIS — Z01419 Encounter for gynecological examination (general) (routine) without abnormal findings: Secondary | ICD-10-CM | POA: Insufficient documentation

## 2013-06-05 DIAGNOSIS — H8110 Benign paroxysmal vertigo, unspecified ear: Secondary | ICD-10-CM

## 2013-06-05 LAB — COMPREHENSIVE METABOLIC PANEL
ALK PHOS: 54 U/L (ref 39–117)
ALT: 21 U/L (ref 0–35)
AST: 19 U/L (ref 0–37)
Albumin: 4.3 g/dL (ref 3.5–5.2)
BILIRUBIN TOTAL: 0.4 mg/dL (ref 0.3–1.2)
BUN: 15 mg/dL (ref 6–23)
CO2: 31 meq/L (ref 19–32)
Calcium: 9.4 mg/dL (ref 8.4–10.5)
Chloride: 102 mEq/L (ref 96–112)
Creatinine, Ser: 0.6 mg/dL (ref 0.4–1.2)
GFR: 102.3 mL/min (ref >60.00)
Glucose, Bld: 104 mg/dL — ABNORMAL HIGH (ref 70–99)
Potassium: 3.9 mEq/L (ref 3.5–5.1)
SODIUM: 141 meq/L (ref 135–145)
TOTAL PROTEIN: 7 g/dL (ref 6.0–8.3)

## 2013-06-05 LAB — LIPID PANEL
CHOL/HDL RATIO: 5
Cholesterol: 175 mg/dL (ref 0–200)
HDL: 36 mg/dL — ABNORMAL LOW (ref >39.00)
LDL CALC: 109 mg/dL — AB (ref 0–99)
Triglycerides: 151 mg/dL — ABNORMAL HIGH (ref 0.0–149.0)
VLDL: 30.2 mg/dL (ref 0.0–40.0)

## 2013-06-05 MED ORDER — TRIAMTERENE-HCTZ 37.5-25 MG PO TABS: 1.0000 | ORAL_TABLET | Freq: Every day | ORAL | Status: DC

## 2013-06-05 MED ORDER — POTASSIUM CHLORIDE ER 10 MEQ PO TBCR: 10.0000 meq | EXTENDED_RELEASE_TABLET | Freq: Every day | ORAL | Status: DC

## 2013-06-05 MED ORDER — SIMVASTATIN 40 MG PO TABS: 40.0000 mg | ORAL_TABLET | Freq: Every day | ORAL | Status: DC

## 2013-06-05 NOTE — Telephone Encounter (Signed)
Relevant patient education assigned to patient using Emmi. ° °

## 2013-06-05 NOTE — Patient Instructions (Addendum)
Check with your insurance to see if they will cover the shingles shot. Go to the lab on the way out.  We'll contact you with your lab report. Call about your mammogram if you aren't already scheduled.  Take care. Don't change your meds.  Glad to see you.

## 2013-06-05 NOTE — Addendum Note (Signed)
Addended by: Josetta Huddle on: 06/05/2013 09:58 AM   Modules accepted: Orders

## 2013-06-05 NOTE — Assessment & Plan Note (Signed)
Likely dx.  No sx now.  Normal exam o/w.  Can use bedside exercises for now and f/u prn.  She agrees.

## 2013-06-05 NOTE — Assessment & Plan Note (Signed)
Continue meds, diet, exercise.  See notes on labs.

## 2013-06-05 NOTE — Assessment & Plan Note (Signed)
Continue meds, diet, exercise.  See notes on labs.  

## 2013-06-05 NOTE — Assessment & Plan Note (Signed)
Routine anticipatory guidance given to patient.  See health maintenance. Tetanus 2014 Flu shot prev done, encouraged, done at work.   Shingles shot d/w pt.   D/w patient [id number] for colon cancer screening, including IFOB vs. colonoscopy.  Risks and benefits of both were discussed and patient voiced understanding.  Pt elects for: IFOB.   Mammogram.  07/2012, she'll check to make sure she is scheduled.   Pap due.  D/w pt.  Done today.  Living will d/w pt.  Would have Norvel Richards designated if she were incapacitated.   Diet and exercise encouraged.  She is working on quitting smoking.  She is trying to lose weight.  Plans to cycle more this year.

## 2013-06-05 NOTE — Progress Notes (Signed)
Pre visit review using our clinic review tool, if applicable. No additional management support is needed unless otherwise documented below in the visit note.  CPE- See plan.  Routine anticipatory guidance given to patient.  See health maintenance. Tetanus 2014 Flu shot prev done, encouraged, done at work.   Shingles shot d/w pt.   D/w patient [id number] for colon cancer screening, including IFOB vs. colonoscopy.  Risks and benefits of both were discussed and patient voiced understanding.  Pt elects for: IFOB.   Mammogram.  07/2012, she'll check to make sure she is scheduled.   Pap due.  D/w pt.  Done today.  Living will d/w pt.  Would have Norvel Richards designated if she were incapacitated.   Diet and exercise encouraged.  She is working on quitting smoking.  She is trying to lose weight.  Plans to cycle more this year.    Hypertension:    Using medication without problems: yes, rarely lightheaded but this was attributed to prev allergy medicine use.  She isn't clearly orthostatic.   Her allergies are better now.  Chest pain with exertion:no Edema:no Short of breath:no  Occ vertigo sx.  Positional. Sounds to be like BPV, episodic, quick onset, quick relief.  Not orthostatic. With head position changes.    Elevated Cholesterol: Using medications without problems:yes Muscle aches: no Diet compliance:yes Exercise:yes Labs pending.   PMH and SH reviewed  Meds, vitals, and allergies reviewed.   ROS: See HPI.  Otherwise negative.    GEN: nad, alert and oriented HEENT: mucous membranes moist, tm wnl B, nasal and OP exam wnl NECK: supple w/o LA CV: rrr. PULM: ctab, no inc wob ABD: soft, +bs EXT: no edema SKIN: no acute rash Normal introitus for age, no external lesions, no vaginal discharge, mucosa pink and moist, no vaginal or cervical lesions, no vaginal atrophy, no friaility or hemorrhage, normal uterus size and position, no adnexal masses or tenderness.  Chaperoned exam.  Pap  collected.

## 2013-06-23 ENCOUNTER — Encounter: Payer: Self-pay | Admitting: Family Medicine

## 2013-06-27 ENCOUNTER — Encounter: Payer: Self-pay | Admitting: *Deleted

## 2014-02-19 ENCOUNTER — Other Ambulatory Visit: Payer: Self-pay | Admitting: *Deleted

## 2014-02-19 MED ORDER — POTASSIUM CHLORIDE ER 10 MEQ PO TBCR: 10.0000 meq | EXTENDED_RELEASE_TABLET | Freq: Every day | ORAL | Status: DC

## 2014-03-20 ENCOUNTER — Other Ambulatory Visit: Payer: Self-pay | Admitting: *Deleted

## 2014-03-20 MED ORDER — POTASSIUM CHLORIDE ER 10 MEQ PO TBCR: 10.0000 meq | EXTENDED_RELEASE_TABLET | Freq: Every day | ORAL | Status: DC

## 2014-05-01 ENCOUNTER — Other Ambulatory Visit: Payer: Self-pay | Admitting: *Deleted

## 2014-05-01 MED ORDER — SIMVASTATIN 40 MG PO TABS: 40.0000 mg | ORAL_TABLET | Freq: Every day | ORAL | Status: DC

## 2014-05-01 MED ORDER — TRIAMTERENE-HCTZ 37.5-25 MG PO TABS: 1.0000 | ORAL_TABLET | Freq: Every day | ORAL | Status: DC

## 2014-06-07 ENCOUNTER — Encounter: Payer: Self-pay | Admitting: Family Medicine

## 2014-06-07 ENCOUNTER — Ambulatory Visit (INDEPENDENT_AMBULATORY_CARE_PROVIDER_SITE_OTHER): Payer: BC Managed Care – PPO | Admitting: Family Medicine

## 2014-06-07 VITALS — BP 130/70 | HR 69 | Temp 98.5°F | Ht 64.0 in | Wt 161.2 lb

## 2014-06-07 DIAGNOSIS — Z720 Tobacco use: Secondary | ICD-10-CM | POA: Diagnosis not present

## 2014-06-07 DIAGNOSIS — E7800 Pure hypercholesterolemia, unspecified: Secondary | ICD-10-CM

## 2014-06-07 DIAGNOSIS — I10 Essential (primary) hypertension: Secondary | ICD-10-CM | POA: Diagnosis not present

## 2014-06-07 DIAGNOSIS — Z1211 Encounter for screening for malignant neoplasm of colon: Secondary | ICD-10-CM

## 2014-06-07 DIAGNOSIS — Z0000 Encounter for general adult medical examination without abnormal findings: Secondary | ICD-10-CM

## 2014-06-07 DIAGNOSIS — Z7189 Other specified counseling: Secondary | ICD-10-CM

## 2014-06-07 DIAGNOSIS — F17200 Nicotine dependence, unspecified, uncomplicated: Secondary | ICD-10-CM

## 2014-06-07 LAB — COMPREHENSIVE METABOLIC PANEL
ALT: 32 U/L (ref 0–35)
AST: 27 U/L (ref 0–37)
Albumin: 4.4 g/dL (ref 3.5–5.2)
Alkaline Phosphatase: 64 U/L (ref 39–117)
BILIRUBIN TOTAL: 0.4 mg/dL (ref 0.2–1.2)
BUN: 12 mg/dL (ref 6–23)
CALCIUM: 10 mg/dL (ref 8.4–10.5)
CO2: 29 mEq/L (ref 19–32)
CREATININE: 0.63 mg/dL (ref 0.40–1.20)
Chloride: 101 mEq/L (ref 96–112)
GFR: 101.95 mL/min (ref >60.00)
Glucose, Bld: 103 mg/dL — ABNORMAL HIGH (ref 70–99)
POTASSIUM: 3.6 meq/L (ref 3.5–5.1)
Sodium: 138 mEq/L (ref 135–145)
Total Protein: 7.3 g/dL (ref 6.0–8.3)

## 2014-06-07 LAB — LIPID PANEL
Cholesterol: 209 mg/dL — ABNORMAL HIGH (ref 0–200)
HDL: 37 mg/dL — AB (ref >39.00)
NonHDL: 172
Total CHOL/HDL Ratio: 6
Triglycerides: 249 mg/dL — ABNORMAL HIGH (ref 0.0–149.0)
VLDL: 49.8 mg/dL — AB (ref 0.0–40.0)

## 2014-06-07 LAB — LDL CHOLESTEROL, DIRECT: Direct LDL: 143 mg/dL

## 2014-06-07 MED ORDER — POTASSIUM CHLORIDE ER 10 MEQ PO TBCR: 10.0000 meq | EXTENDED_RELEASE_TABLET | Freq: Every day | ORAL | Status: DC

## 2014-06-07 MED ORDER — TRIAMTERENE-HCTZ 37.5-25 MG PO TABS: 1.0000 | ORAL_TABLET | Freq: Every day | ORAL | Status: DC

## 2014-06-07 MED ORDER — SIMVASTATIN 40 MG PO TABS: 40.0000 mg | ORAL_TABLET | Freq: Every day | ORAL | Status: DC

## 2014-06-07 NOTE — Progress Notes (Signed)
Pre visit review using our clinic review tool, if applicable. No additional management support is needed unless otherwise documented below in the visit note.  CPE- See plan.  Routine anticipatory guidance given to patient.  See health maintenance. Tetanus 2014 Flu shot prev done, encouraged, done at work 2015 Shingles shot d/w pt.  D/w patient [id number] for colon cancer screening, including IFOB vs. colonoscopy. Risks and benefits of both were discussed and patient voiced understanding. Pt elects for: IFOB.  Mammogram is pending for  06/2014 Pap up to date.   Living will d/w pt. Would have Norvel Richards designated if she were incapacitated.  Diet and exercise encouraged. "Diet is not great."  She is trying to lose weight.  She is working on quitting smoking, she had quit last year but got laid off and restarted with a few per day.  Encouraged cessation.   Elevated Cholesterol: Using medications without problems: yes Muscle aches: no Diet compliance: see above Exercise: see above  Hypertension:    Using medication without problems or lightheadedness: yes Chest pain with exertion:no Edema:no Short of breath:no  PMH and SH reviewed  Meds, vitals, and allergies reviewed.   ROS: See HPI.  Otherwise negative.    GEN: nad, alert and oriented HEENT: mucous membranes moist NECK: supple w/o LA CV: rrr. PULM: ctab, no inc wob ABD: soft, +bs EXT: no edema SKIN: no acute rash but nonirritated skin tags noted.

## 2014-06-07 NOTE — Patient Instructions (Addendum)
Check with your insurance to see if they will cover the shingles shot. Go to the lab on the way out.  We'll contact you with your lab report. I would get a flu shot each fall.   Take care.  Glad to see you.  Work on quitting smoking and let me know if I can help.

## 2014-06-08 DIAGNOSIS — Z7189 Other specified counseling: Secondary | ICD-10-CM | POA: Insufficient documentation

## 2014-06-08 NOTE — Assessment & Plan Note (Signed)
Routine anticipatory guidance given to patient. See health maintenance.  Tetanus 2014  Flu shot prev done, encouraged, done at work 2015  Shingles shot d/w pt.  D/w patient [id number] for colon cancer screening, including IFOB vs. colonoscopy. Risks and benefits of both were discussed and patient voiced understanding. Pt elects for: IFOB.  Mammogram is pending for 06/2014  Pap up to date.  Living will d/w pt. Would have Norvel Richards designated if she were incapacitated.  Diet and exercise encouraged. "Diet is not great." She is trying to lose weight.  She is working on quitting smoking, she had quit last year but got laid off and restarted with a few per day. Encouraged cessation.

## 2014-06-08 NOTE — Assessment & Plan Note (Signed)
No ADE on med, continue med but needs work on diet and weight, with inc in TG noted.  See notes on labs.

## 2014-06-08 NOTE — Assessment & Plan Note (Signed)
Encouraged cessation.

## 2014-06-11 ENCOUNTER — Encounter: Payer: Self-pay | Admitting: *Deleted

## 2014-06-18 ENCOUNTER — Encounter: Payer: Self-pay | Admitting: *Deleted

## 2014-06-18 ENCOUNTER — Other Ambulatory Visit (INDEPENDENT_AMBULATORY_CARE_PROVIDER_SITE_OTHER): Payer: BC Managed Care – PPO

## 2014-06-18 DIAGNOSIS — Z1211 Encounter for screening for malignant neoplasm of colon: Secondary | ICD-10-CM | POA: Diagnosis not present

## 2014-06-18 LAB — FECAL OCCULT BLOOD, IMMUNOCHEMICAL: Fecal Occult Bld: NEGATIVE

## 2014-06-28 ENCOUNTER — Encounter: Payer: Self-pay | Admitting: Family Medicine

## 2015-04-24 ENCOUNTER — Encounter: Payer: Self-pay | Admitting: Family Medicine

## 2015-04-25 ENCOUNTER — Other Ambulatory Visit: Payer: Self-pay | Admitting: *Deleted

## 2015-04-25 MED ORDER — TRIAMTERENE-HCTZ 37.5-25 MG PO TABS: 1.0000 | ORAL_TABLET | Freq: Every day | ORAL | Status: DC

## 2015-04-25 MED ORDER — SIMVASTATIN 40 MG PO TABS: 40.0000 mg | ORAL_TABLET | Freq: Every day | ORAL | Status: DC

## 2015-04-25 MED ORDER — POTASSIUM CHLORIDE ER 10 MEQ PO TBCR: 10.0000 meq | EXTENDED_RELEASE_TABLET | Freq: Every day | ORAL | Status: DC

## 2015-04-29 ENCOUNTER — Other Ambulatory Visit: Payer: Self-pay | Admitting: *Deleted

## 2015-04-29 MED ORDER — POTASSIUM CHLORIDE ER 10 MEQ PO TBCR: 10.0000 meq | EXTENDED_RELEASE_TABLET | Freq: Every day | ORAL | Status: DC

## 2015-04-30 ENCOUNTER — Encounter: Payer: Self-pay | Admitting: Family Medicine

## 2015-04-30 ENCOUNTER — Other Ambulatory Visit: Payer: Self-pay | Admitting: *Deleted

## 2015-04-30 MED ORDER — POTASSIUM CHLORIDE ER 10 MEQ PO TBCR: 10.0000 meq | EXTENDED_RELEASE_TABLET | Freq: Every day | ORAL | Status: DC

## 2015-04-30 MED ORDER — SIMVASTATIN 40 MG PO TABS: 40.0000 mg | ORAL_TABLET | Freq: Every day | ORAL | Status: DC

## 2015-04-30 MED ORDER — TRIAMTERENE-HCTZ 37.5-25 MG PO TABS: 1.0000 | ORAL_TABLET | Freq: Every day | ORAL | Status: DC

## 2015-06-10 ENCOUNTER — Other Ambulatory Visit: Payer: Self-pay | Admitting: Family Medicine

## 2015-06-10 ENCOUNTER — Ambulatory Visit (INDEPENDENT_AMBULATORY_CARE_PROVIDER_SITE_OTHER): Payer: BC Managed Care – PPO | Admitting: Family Medicine

## 2015-06-10 ENCOUNTER — Encounter: Payer: Self-pay | Admitting: Family Medicine

## 2015-06-10 VITALS — BP 124/70 | HR 71 | Temp 98.0°F | Wt 161.0 lb

## 2015-06-10 DIAGNOSIS — E785 Hyperlipidemia, unspecified: Secondary | ICD-10-CM

## 2015-06-10 DIAGNOSIS — F17200 Nicotine dependence, unspecified, uncomplicated: Secondary | ICD-10-CM

## 2015-06-10 DIAGNOSIS — Z119 Encounter for screening for infectious and parasitic diseases, unspecified: Secondary | ICD-10-CM

## 2015-06-10 DIAGNOSIS — E7800 Pure hypercholesterolemia, unspecified: Secondary | ICD-10-CM

## 2015-06-10 DIAGNOSIS — Z1211 Encounter for screening for malignant neoplasm of colon: Secondary | ICD-10-CM

## 2015-06-10 DIAGNOSIS — Z0000 Encounter for general adult medical examination without abnormal findings: Secondary | ICD-10-CM | POA: Diagnosis not present

## 2015-06-10 DIAGNOSIS — I10 Essential (primary) hypertension: Secondary | ICD-10-CM

## 2015-06-10 LAB — LIPID PANEL
CHOL/HDL RATIO: 6
Cholesterol: 221 mg/dL — ABNORMAL HIGH (ref 0–200)
HDL: 38.4 mg/dL — ABNORMAL LOW (ref >39.00)
LDL CALC: 147 mg/dL — AB (ref 0–99)
NonHDL: 182.96
Triglycerides: 180 mg/dL — ABNORMAL HIGH (ref 0.0–149.0)
VLDL: 36 mg/dL (ref 0.0–40.0)

## 2015-06-10 LAB — COMPREHENSIVE METABOLIC PANEL
ALT: 26 U/L (ref 0–35)
AST: 20 U/L (ref 0–37)
Albumin: 4.6 g/dL (ref 3.5–5.2)
Alkaline Phosphatase: 61 U/L (ref 39–117)
BUN: 15 mg/dL (ref 6–23)
CHLORIDE: 101 meq/L (ref 96–112)
CO2: 31 meq/L (ref 19–32)
CREATININE: 0.66 mg/dL (ref 0.40–1.20)
Calcium: 9.7 mg/dL (ref 8.4–10.5)
GFR: 96.31 mL/min (ref >60.00)
Glucose, Bld: 111 mg/dL — ABNORMAL HIGH (ref 70–99)
POTASSIUM: 3.8 meq/L (ref 3.5–5.1)
SODIUM: 140 meq/L (ref 135–145)
Total Bilirubin: 0.5 mg/dL (ref 0.2–1.2)
Total Protein: 7.3 g/dL (ref 6.0–8.3)

## 2015-06-10 MED ORDER — TRIAMTERENE-HCTZ 37.5-25 MG PO TABS: 1.0000 | ORAL_TABLET | Freq: Every day | ORAL | Status: DC

## 2015-06-10 MED ORDER — SIMVASTATIN 40 MG PO TABS: 40.0000 mg | ORAL_TABLET | Freq: Every day | ORAL | Status: DC

## 2015-06-10 MED ORDER — POTASSIUM CHLORIDE ER 10 MEQ PO TBCR: 10.0000 meq | EXTENDED_RELEASE_TABLET | Freq: Every day | ORAL | Status: DC

## 2015-06-10 NOTE — Patient Instructions (Addendum)
Check with your insurance to see if they will cover the shingles shot. Go to the lab on the way out.  We'll contact you with your lab report. Keep working on stopping smoking.  Let me know if I can help.  Take care.  Glad to see you.

## 2015-06-10 NOTE — Progress Notes (Signed)
Pre visit review using our clinic review tool, if applicable. No additional management support is needed unless otherwise documented below in the visit note.  CPE- See plan.  Routine anticipatory guidance given to patient.  See health maintenance. Tetanus 2014 Flu shot prev done 11/22/14.  Shingles shot d/w pt.  D/w patient [id number] for colon cancer screening, including IFOB vs. colonoscopy. Risks and benefits of both were discussed and patient voiced understanding. Pt elects for: IFOB.  Mammogram encouraged, pending.   Pap up to date. Living will d/w pt. Would have Norvel Richards designated if she were incapacitated.  Diet and exercise encouraged. "Diet is not great." She is trying to lose weight.  She is working on quitting smoking, she has cut back to a few per day. Encouraged cessation.  HIV and HCV screening done with [facility] screening with blood donation last done ~2 years ago.  D/w pt.  She wanted to get rechecked today for both, d/w pt about routine screening.   Hypertension:    Using medication without problems or lightheadedness: yes Chest pain with exertion:no Edema:no Short of breath:no  Elevated Cholesterol: Using medications without problems:yes Muscle aches: no Diet compliance:encouraged Exercise:encouraged Labs pending.    PMH and SH reviewed  Meds, vitals, and allergies reviewed.   ROS: Per HPI.  Unless specifically indicated otherwise in HPI, the patient denies:  General: fever. Eyes: acute vision changes ENT: sore throat Cardiovascular: chest pain Respiratory: SOB GI: vomiting GU: dysuria Musculoskeletal: acute back pain Derm: acute rash Neuro: acute motor dysfunction Psych: worsening mood Endocrine: polydipsia Heme: bleeding Allergy: hayfever  GEN: nad, alert and oriented HEENT: mucous membranes moist NECK: supple w/o LA CV: rrr. PULM: ctab, no inc wob ABD: soft, +bs EXT: no edema

## 2015-06-10 NOTE — Addendum Note (Signed)
Addended by: Daralene Milch C on: 06/10/2015 11:32 AM   Modules accepted: Miquel Dunn

## 2015-06-10 NOTE — Assessment & Plan Note (Signed)
Encouraged cessation.  She is working on it.

## 2015-06-10 NOTE — Assessment & Plan Note (Signed)
Tetanus 2014  Flu shot prev done 11/22/14.  Shingles shot d/w pt.  D/w patient [id number] for colon cancer screening, including IFOB vs. colonoscopy. Risks and benefits of both were discussed and patient voiced understanding. Pt elects for: IFOB.  Mammogram encouraged, pending.  Pap up to date.  Living will d/w pt. Would have Norvel Richards designated if she were incapacitated.  Diet and exercise encouraged. "Diet is not great." She is trying to lose weight.  She is working on quitting smoking, she has cut back to a few per day. Encouraged cessation.  HIV and HCV screening done with [facility] screening with blood donation last done ~2 years ago. D/w pt. She wanted to get rechecked today for both, d/w pt about routine screening.

## 2015-06-10 NOTE — Assessment & Plan Note (Signed)
Continue current meds.  See notes on labs.  See above.

## 2015-06-11 LAB — HEPATITIS C ANTIBODY: HCV AB: NEGATIVE

## 2015-06-11 LAB — HIV ANTIBODY (ROUTINE TESTING W REFLEX): HIV: NONREACTIVE

## 2015-06-21 ENCOUNTER — Other Ambulatory Visit (INDEPENDENT_AMBULATORY_CARE_PROVIDER_SITE_OTHER): Payer: BC Managed Care – PPO

## 2015-06-21 DIAGNOSIS — Z1211 Encounter for screening for malignant neoplasm of colon: Secondary | ICD-10-CM | POA: Diagnosis not present

## 2015-06-21 LAB — FECAL OCCULT BLOOD, IMMUNOCHEMICAL: FECAL OCCULT BLD: NEGATIVE

## 2015-07-09 ENCOUNTER — Encounter: Payer: Self-pay | Admitting: Family Medicine

## 2015-08-02 ENCOUNTER — Encounter: Payer: Self-pay | Admitting: Family Medicine

## 2015-08-02 ENCOUNTER — Ambulatory Visit (INDEPENDENT_AMBULATORY_CARE_PROVIDER_SITE_OTHER): Payer: BC Managed Care – PPO | Admitting: Family Medicine

## 2015-08-02 VITALS — BP 146/78 | HR 64 | Temp 99.0°F | Wt 161.5 lb

## 2015-08-02 DIAGNOSIS — M25512 Pain in left shoulder: Secondary | ICD-10-CM | POA: Diagnosis not present

## 2015-08-02 MED ORDER — MELOXICAM 15 MG PO TABS: 15.0000 mg | ORAL_TABLET | Freq: Every day | ORAL | Status: DC

## 2015-08-02 NOTE — Progress Notes (Signed)
Pre visit review using our clinic review tool, if applicable. No additional management support is needed unless otherwise documented below in the visit note.  L shoulder pain.  Started about a few weeks ago.  Initially in the posterior area, upper scapula.  She thought she had pulled a muscle.  Has still been doing ROM exercises.  Pain sleeping on L side at night. Pain with movement above her head.  No trauma. No R shoulder pain.  R handed.  Grip WNL B.   Meds, vitals, and allergies reviewed.   ROS: Per HPI unless specifically indicated in ROS section   nad ncat Neck supple, no LA, normal ROM L shoulder with pain with AROM for abduction.  Pain with int/ext rotation.   + impingement.   No arm drop.  Distally NV intact.  AC not ttp

## 2015-08-02 NOTE — Patient Instructions (Signed)
Likely rotator cuff irritation but I don't think you have a full tear.  Take mobic daily with food for pain.  No extra ibuprofen.  Use the shoulder exercises.  If not better, then call back about seeing Dr. Lorelei Pont.  Take care.  Glad to see you.

## 2015-08-04 DIAGNOSIS — M25512 Pain in left shoulder: Secondary | ICD-10-CM | POA: Insufficient documentation

## 2015-08-04 NOTE — Assessment & Plan Note (Signed)
Likely rotator cuff irritation but I don't think she has a full tear.  Take mobic daily with food for pain.  No extra ibuprofen.  Use the shoulder exercises, handout d/w pt.   If not better, then call back about seeing Dr. Lorelei Pont.  She agrees.  We didn't image at this point.

## 2016-06-11 ENCOUNTER — Encounter: Payer: BC Managed Care – PPO | Admitting: Family Medicine

## 2016-06-11 ENCOUNTER — Other Ambulatory Visit: Payer: Self-pay | Admitting: Family Medicine

## 2016-06-11 ENCOUNTER — Other Ambulatory Visit (INDEPENDENT_AMBULATORY_CARE_PROVIDER_SITE_OTHER): Payer: BC Managed Care – PPO

## 2016-06-11 DIAGNOSIS — E559 Vitamin D deficiency, unspecified: Secondary | ICD-10-CM | POA: Diagnosis not present

## 2016-06-11 DIAGNOSIS — E7800 Pure hypercholesterolemia, unspecified: Secondary | ICD-10-CM

## 2016-06-11 DIAGNOSIS — I10 Essential (primary) hypertension: Secondary | ICD-10-CM

## 2016-06-11 LAB — COMPREHENSIVE METABOLIC PANEL
ALBUMIN: 4.6 g/dL (ref 3.5–5.2)
ALT: 26 U/L (ref 0–35)
AST: 24 U/L (ref 0–37)
Alkaline Phosphatase: 59 U/L (ref 39–117)
BUN: 13 mg/dL (ref 6–23)
CHLORIDE: 102 meq/L (ref 96–112)
CO2: 31 mEq/L (ref 19–32)
Calcium: 9.7 mg/dL (ref 8.4–10.5)
Creatinine, Ser: 0.65 mg/dL (ref 0.40–1.20)
GFR: 97.7 mL/min (ref >60.00)
Glucose, Bld: 114 mg/dL — ABNORMAL HIGH (ref 70–99)
POTASSIUM: 3.5 meq/L (ref 3.5–5.1)
SODIUM: 141 meq/L (ref 135–145)
Total Bilirubin: 0.5 mg/dL (ref 0.2–1.2)
Total Protein: 7.1 g/dL (ref 6.0–8.3)

## 2016-06-11 LAB — LIPID PANEL
CHOLESTEROL: 187 mg/dL (ref 0–200)
HDL: 32.8 mg/dL — ABNORMAL LOW (ref >39.00)
LDL CALC: 128 mg/dL — AB (ref 0–99)
NonHDL: 154.21
TRIGLYCERIDES: 129 mg/dL (ref 0.0–149.0)
Total CHOL/HDL Ratio: 6
VLDL: 25.8 mg/dL (ref 0.0–40.0)

## 2016-06-12 LAB — VITAMIN D 25 HYDROXY (VIT D DEFICIENCY, FRACTURES): VITD: 23.93 ng/mL — ABNORMAL LOW (ref 30.00–100.00)

## 2016-06-16 ENCOUNTER — Ambulatory Visit (INDEPENDENT_AMBULATORY_CARE_PROVIDER_SITE_OTHER): Payer: BC Managed Care – PPO | Admitting: Family Medicine

## 2016-06-16 ENCOUNTER — Encounter: Payer: Self-pay | Admitting: Family Medicine

## 2016-06-16 ENCOUNTER — Other Ambulatory Visit (HOSPITAL_COMMUNITY)
Admission: RE | Admit: 2016-06-16 | Discharge: 2016-06-16 | Disposition: A | Discharge status: Home / Self Care | Payer: BC Managed Care – PPO | Source: Ambulatory Visit | Attending: Family Medicine | Admitting: Family Medicine

## 2016-06-16 VITALS — BP 142/72 | HR 81 | Temp 98.7°F | Ht 64.0 in | Wt 159.5 lb

## 2016-06-16 DIAGNOSIS — E749 Disorder of carbohydrate metabolism, unspecified: Secondary | ICD-10-CM

## 2016-06-16 DIAGNOSIS — I10 Essential (primary) hypertension: Secondary | ICD-10-CM

## 2016-06-16 DIAGNOSIS — Z7189 Other specified counseling: Secondary | ICD-10-CM

## 2016-06-16 DIAGNOSIS — Z124 Encounter for screening for malignant neoplasm of cervix: Secondary | ICD-10-CM | POA: Diagnosis not present

## 2016-06-16 DIAGNOSIS — E559 Vitamin D deficiency, unspecified: Secondary | ICD-10-CM

## 2016-06-16 DIAGNOSIS — Z0000 Encounter for general adult medical examination without abnormal findings: Secondary | ICD-10-CM

## 2016-06-16 DIAGNOSIS — E7800 Pure hypercholesterolemia, unspecified: Secondary | ICD-10-CM

## 2016-06-16 DIAGNOSIS — Z1211 Encounter for screening for malignant neoplasm of colon: Secondary | ICD-10-CM

## 2016-06-16 MED ORDER — TRIAMTERENE-HCTZ 37.5-25 MG PO TABS: 1.0000 | ORAL_TABLET | Freq: Every day | ORAL | 3 refills | Status: DC

## 2016-06-16 MED ORDER — POTASSIUM CHLORIDE ER 10 MEQ PO TBCR: 10.0000 meq | EXTENDED_RELEASE_TABLET | Freq: Every day | ORAL | 3 refills | Status: DC

## 2016-06-16 MED ORDER — VITAMIN D3 25 MCG (1000 UNIT) PO TABS: 2000.0000 [IU] | ORAL_TABLET | Freq: Every day | ORAL | Status: DC

## 2016-06-16 MED ORDER — SIMVASTATIN 40 MG PO TABS: 40.0000 mg | ORAL_TABLET | Freq: Every day | ORAL | 3 refills | Status: DC

## 2016-06-16 NOTE — Patient Instructions (Addendum)
I'll check on the shingles shot and your allergy history.   Go to the lab on the way out.  We'll contact you with your lab report (stool cards). Recheck labs next year.  Keep working on diet and exercise.   Add on Vitamin D 2000 IU a day.  Take care.  Glad to see you.  Update me as needed.

## 2016-06-16 NOTE — Progress Notes (Signed)
CPE- See plan.  Routine anticipatory guidance given to patient.  See health maintenance.  The possibility exists that previously documented standard health maintenance information may have been brought forward from a previous encounter into this note.  If needed, that same information has been updated to reflect the current situation based on today's encounter.    Tetanus 2014  Flu shot prev done Shingles shot d/w pt.  PNA shot not due yet.   D/w patient [id number] for colon cancer screening, including IFOB vs. colonoscopy. Risks and benefits of both were discussed and patient voiced understanding. Pt elects for: IFOB.  Mammogram encouraged, pending.  Pap 2018 Living will d/w pt. Would have Norvel Richards designated if she were incapacitated.  Diet and exercise encouraged. "I lose weight and then I put it back on." She is trying to lose weight.  D/w pt.   She is working on quitting smoking, she has cut back to a few per day. Encouraged cessation again.  HIV and HCV screening done 2017.   Her L shoulder pain is better.  Normal ROM B now.  No complaints.    Hypertension:    Using medication without problems or lightheadedness: yes Chest pain with exertion:no Edema:no Short of breath:no  Elevated Cholesterol: Using medications without problems:yes Muscle aches: no Diet compliance: encouraged.  Exercise:encouraged.    Mild hyperglycemia.  D/w pt.   Vit D low, d/w pt.   Labs d/w pt.    PMH and SH reviewed  Meds, vitals, and allergies reviewed.   ROS: Per HPI.  Unless specifically indicated otherwise in HPI, the patient denies:  General: fever. Eyes: acute vision changes ENT: sore throat Cardiovascular: chest pain Respiratory: SOB GI: vomiting GU: dysuria Musculoskeletal: acute back pain Derm: acute rash Neuro: acute motor dysfunction Psych: worsening mood Endocrine: polydipsia Heme: bleeding Allergy: hayfever  GEN: nad, alert and oriented HEENT: mucous membranes  moist NECK: supple w/o LA CV: rrr. PULM: ctab, no inc wob ABD: soft, +bs EXT: no edema SKIN: no acute rash Normal introitus for age, no external lesions, no vaginal discharge, mucosa pink and moist, no vaginal or cervical lesions, no vaginal atrophy, no friaility or hemorrhage, normal uterus size and position, no adnexal masses or tenderness.  Chaperoned exam.  Pap collected.

## 2016-06-17 DIAGNOSIS — E559 Vitamin D deficiency, unspecified: Secondary | ICD-10-CM | POA: Insufficient documentation

## 2016-06-17 NOTE — Assessment & Plan Note (Signed)
Tetanus 2014  Flu shot prev done Shingles shot d/w pt.  PNA shot not due yet.   D/w patient [id number] for colon cancer screening, including IFOB vs. colonoscopy. Risks and benefits of both were discussed and patient voiced understanding. Pt elects for: IFOB.  Mammogram encouraged, pending.  Pap 2018 Living will d/w pt. Would have Norvel Richards designated if she were incapacitated.  Diet and exercise encouraged. "I lose weight and then I put it back on." She is trying to lose weight.  D/w pt.   She is working on quitting smoking, she has cut back to a few per day. Encouraged cessation again.  HIV and HCV screening done 2017.

## 2016-06-17 NOTE — Assessment & Plan Note (Signed)
Add on Vitamin D 2000 IU a day.

## 2016-06-17 NOTE — Assessment & Plan Note (Signed)
Living will d/w pt. Would have Selena Beasley designated if she were incapacitated.

## 2016-06-17 NOTE — Assessment & Plan Note (Signed)
Discussed with patient about labs and diet and exercise. We can recheck periodically.

## 2016-06-17 NOTE — Assessment & Plan Note (Signed)
Continue medication. Continue work on diet and exercise. Labs discussed with patient.

## 2016-06-18 ENCOUNTER — Encounter: Payer: Self-pay | Admitting: *Deleted

## 2016-06-18 LAB — CYTOLOGY - PAP
Diagnosis: NEGATIVE
HPV (WINDOPATH): NOT DETECTED

## 2016-06-20 ENCOUNTER — Emergency Department: Payer: No Typology Code available for payment source

## 2016-06-20 ENCOUNTER — Emergency Department
Admission: EM | Admit: 2016-06-20 | Discharge: 2016-06-20 | Disposition: A | Discharge status: Home / Self Care | Payer: No Typology Code available for payment source | Attending: Emergency Medicine | Admitting: Emergency Medicine

## 2016-06-20 DIAGNOSIS — Y999 Unspecified external cause status: Secondary | ICD-10-CM | POA: Insufficient documentation

## 2016-06-20 DIAGNOSIS — F17210 Nicotine dependence, cigarettes, uncomplicated: Secondary | ICD-10-CM | POA: Insufficient documentation

## 2016-06-20 DIAGNOSIS — S161XXA Strain of muscle, fascia and tendon at neck level, initial encounter: Secondary | ICD-10-CM

## 2016-06-20 DIAGNOSIS — Y92410 Unspecified street and highway as the place of occurrence of the external cause: Secondary | ICD-10-CM | POA: Insufficient documentation

## 2016-06-20 DIAGNOSIS — I10 Essential (primary) hypertension: Secondary | ICD-10-CM | POA: Insufficient documentation

## 2016-06-20 DIAGNOSIS — T23261A Burn of second degree of back of right hand, initial encounter: Secondary | ICD-10-CM

## 2016-06-20 DIAGNOSIS — Z79899 Other long term (current) drug therapy: Secondary | ICD-10-CM | POA: Insufficient documentation

## 2016-06-20 DIAGNOSIS — Y939 Activity, unspecified: Secondary | ICD-10-CM | POA: Insufficient documentation

## 2016-06-20 MED ORDER — SILVER SULFADIAZINE 1 % EX CREA: TOPICAL_CREAM | CUTANEOUS | 1 refills | Status: DC

## 2016-06-20 MED ORDER — KETOROLAC TROMETHAMINE 30 MG/ML IJ SOLN
30.0000 mg | Freq: Once | INTRAMUSCULAR | Status: AC
  Administered 2016-06-20: 30 mg via INTRAMUSCULAR
  Filled 2016-06-20: qty 1

## 2016-06-20 MED ORDER — SILVER SULFADIAZINE 1 % EX CREA
TOPICAL_CREAM | CUTANEOUS | Status: AC
  Filled 2016-06-20: qty 85

## 2016-06-20 MED ORDER — CYCLOBENZAPRINE HCL 10 MG PO TABS
5.0000 mg | ORAL_TABLET | Freq: Once | ORAL | Status: AC
  Administered 2016-06-20: 5 mg via ORAL
  Filled 2016-06-20: qty 1

## 2016-06-20 MED ORDER — SILVER SULFADIAZINE 1 % EX CREA
TOPICAL_CREAM | Freq: Once | CUTANEOUS | Status: AC
  Administered 2016-06-20: 21:00:00 via TOPICAL

## 2016-06-20 MED ORDER — CYCLOBENZAPRINE HCL 5 MG PO TABS: 5.0000 mg | ORAL_TABLET | Freq: Three times a day (TID) | ORAL | 0 refills | Status: DC | PRN
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
## 2016-06-21 ENCOUNTER — Other Ambulatory Visit: Payer: Self-pay | Admitting: Family Medicine

## 2016-06-21 MED ORDER — SIMVASTATIN 40 MG PO TABS: 40.0000 mg | ORAL_TABLET | Freq: Every day | ORAL | 3 refills | Status: DC

## 2016-06-21 MED ORDER — TRIAMTERENE-HCTZ 37.5-25 MG PO TABS: 1.0000 | ORAL_TABLET | Freq: Every day | ORAL | 3 refills | Status: DC

## 2016-06-21 MED ORDER — POTASSIUM CHLORIDE ER 10 MEQ PO TBCR: 10.0000 meq | EXTENDED_RELEASE_TABLET | Freq: Every day | ORAL | 3 refills | Status: DC

## 2016-07-03 ENCOUNTER — Other Ambulatory Visit (INDEPENDENT_AMBULATORY_CARE_PROVIDER_SITE_OTHER): Payer: BC Managed Care – PPO

## 2016-07-03 DIAGNOSIS — Z1211 Encounter for screening for malignant neoplasm of colon: Secondary | ICD-10-CM

## 2016-07-03 LAB — FECAL OCCULT BLOOD, IMMUNOCHEMICAL: FECAL OCCULT BLD: NEGATIVE

## 2016-07-06 ENCOUNTER — Encounter: Payer: Self-pay | Admitting: *Deleted

## 2016-07-07 ENCOUNTER — Encounter: Payer: Self-pay | Admitting: Family Medicine

## 2016-07-07 ENCOUNTER — Telehealth: Payer: Self-pay | Admitting: Family Medicine

## 2016-07-07 NOTE — Telephone Encounter (Signed)
Left detailed message on voicemail.  

## 2016-07-07 NOTE — Telephone Encounter (Signed)
Call pt.  I did some more checking re: shingles vaccine.  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  She can tolerate plain Bacitracin.  The issue is neomycin.  If she had an allergy to that, then she shouldn't get the shingles vaccine.  Neosporin has Bacitracin, neomycin, and polymyxin B.  If she had an allergy to neomycin that was attributed to polymixin B, and then got the vaccine, then she could have an adverse event.  If that is the case, both she and her pharmacist were correct when she didn't get the vaccine.   I updated her med and allergy list in the meantime.   I thank her for talking to me about this; I appreciate her diligence.   Thanks.  Let me know if she has questions.

## 2016-07-08 ENCOUNTER — Encounter: Payer: Self-pay | Admitting: *Deleted

## 2016-07-12 ENCOUNTER — Other Ambulatory Visit: Payer: Self-pay | Admitting: Family Medicine

## 2016-07-23 ENCOUNTER — Telehealth: Payer: Self-pay | Admitting: *Deleted

## 2016-07-23 NOTE — Telephone Encounter (Signed)
Pharmacy contacted office and stated Maxzide tab on back order. Per Dr Damita Dunnings, ok to dispense capsules

## 2016-07-28 ENCOUNTER — Encounter: Payer: BC Managed Care – PPO | Admitting: Family Medicine

## 2017-05-24 IMAGING — DX DG HAND COMPLETE 3+V*R*
3 series · 3 of 3 positions shown · non-contrast
Comparison: None.

CLINICAL DATA: Bruising over right thumb

EXAM:
RIGHT HAND - COMPLETE 3+ VIEW

[hand ap]
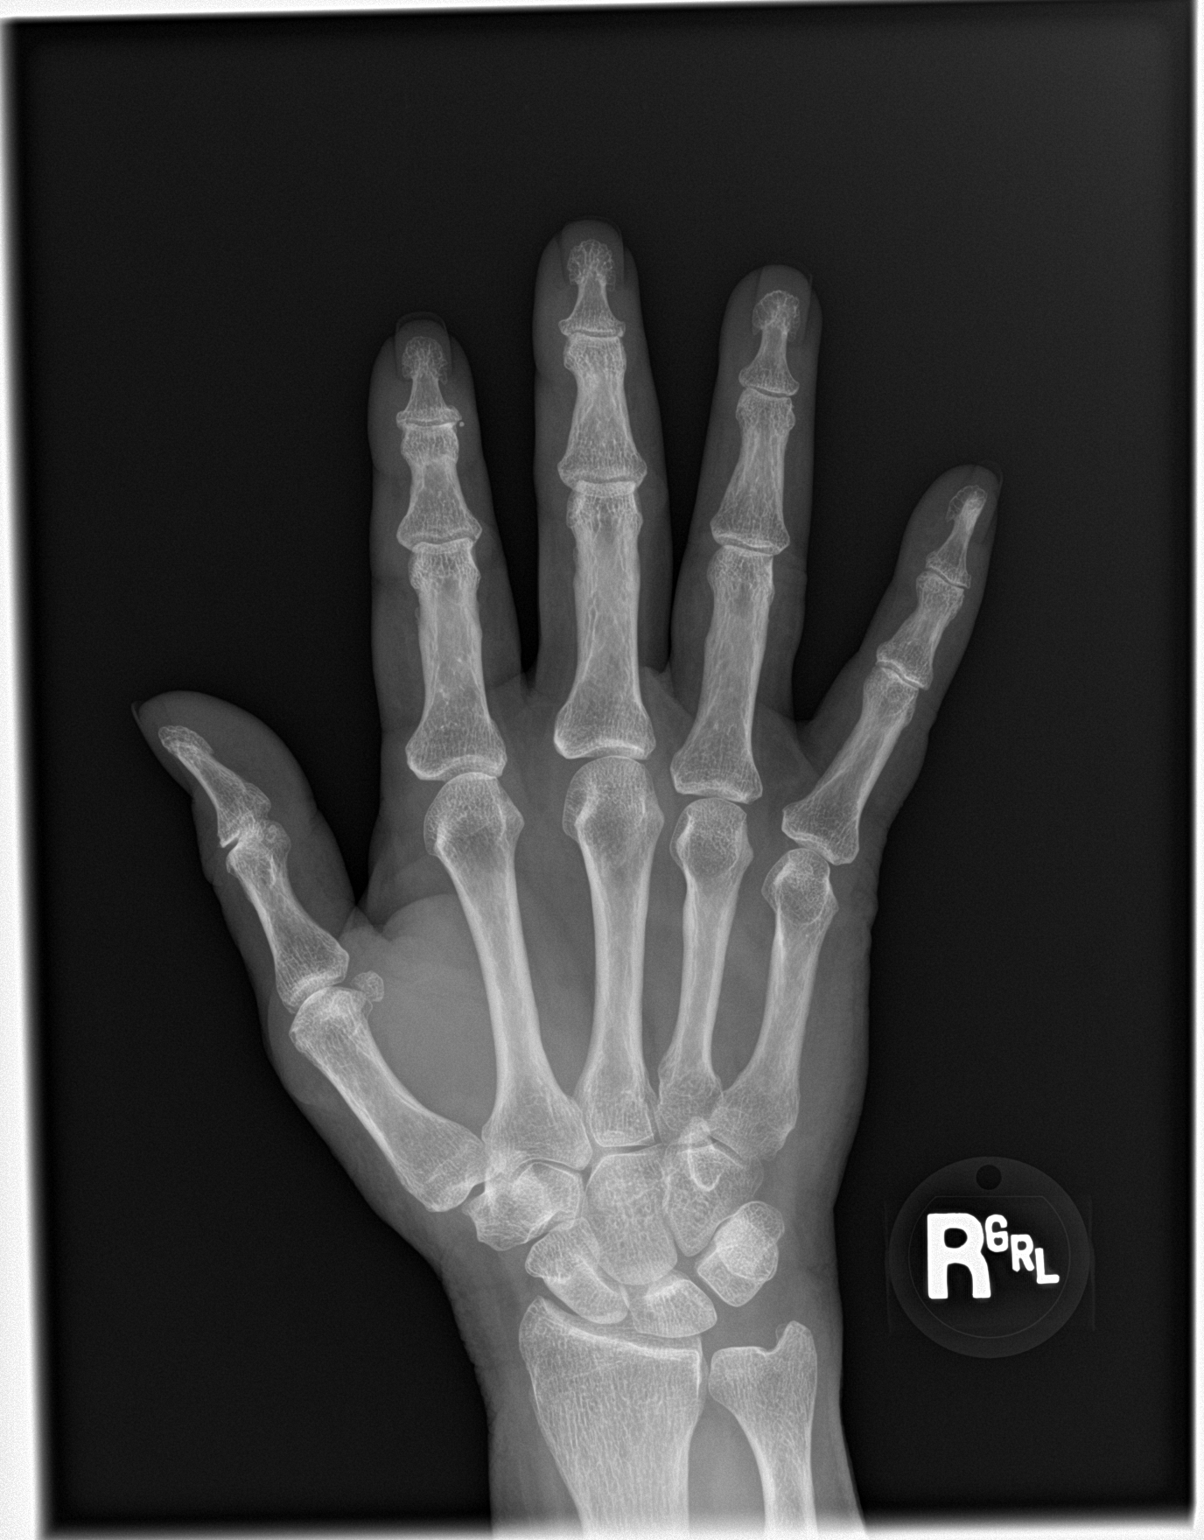

[hand obl]
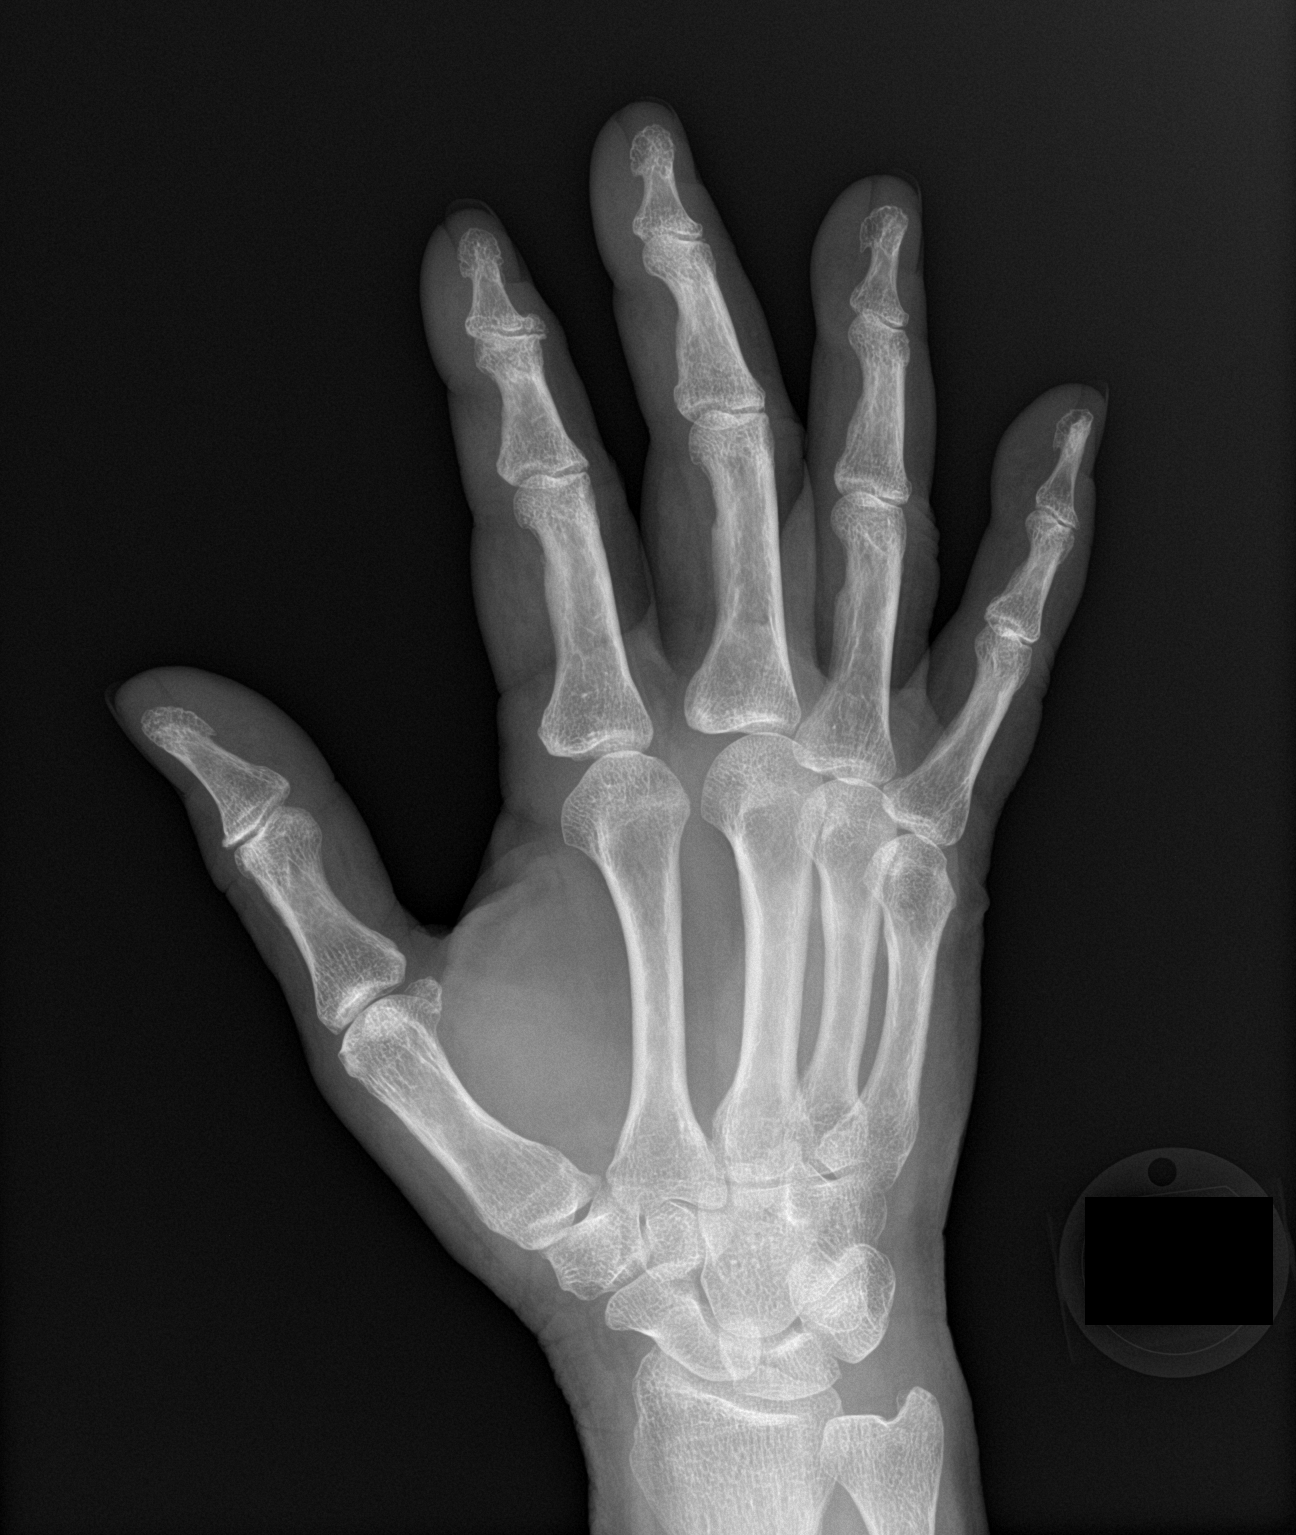

[hand lat]
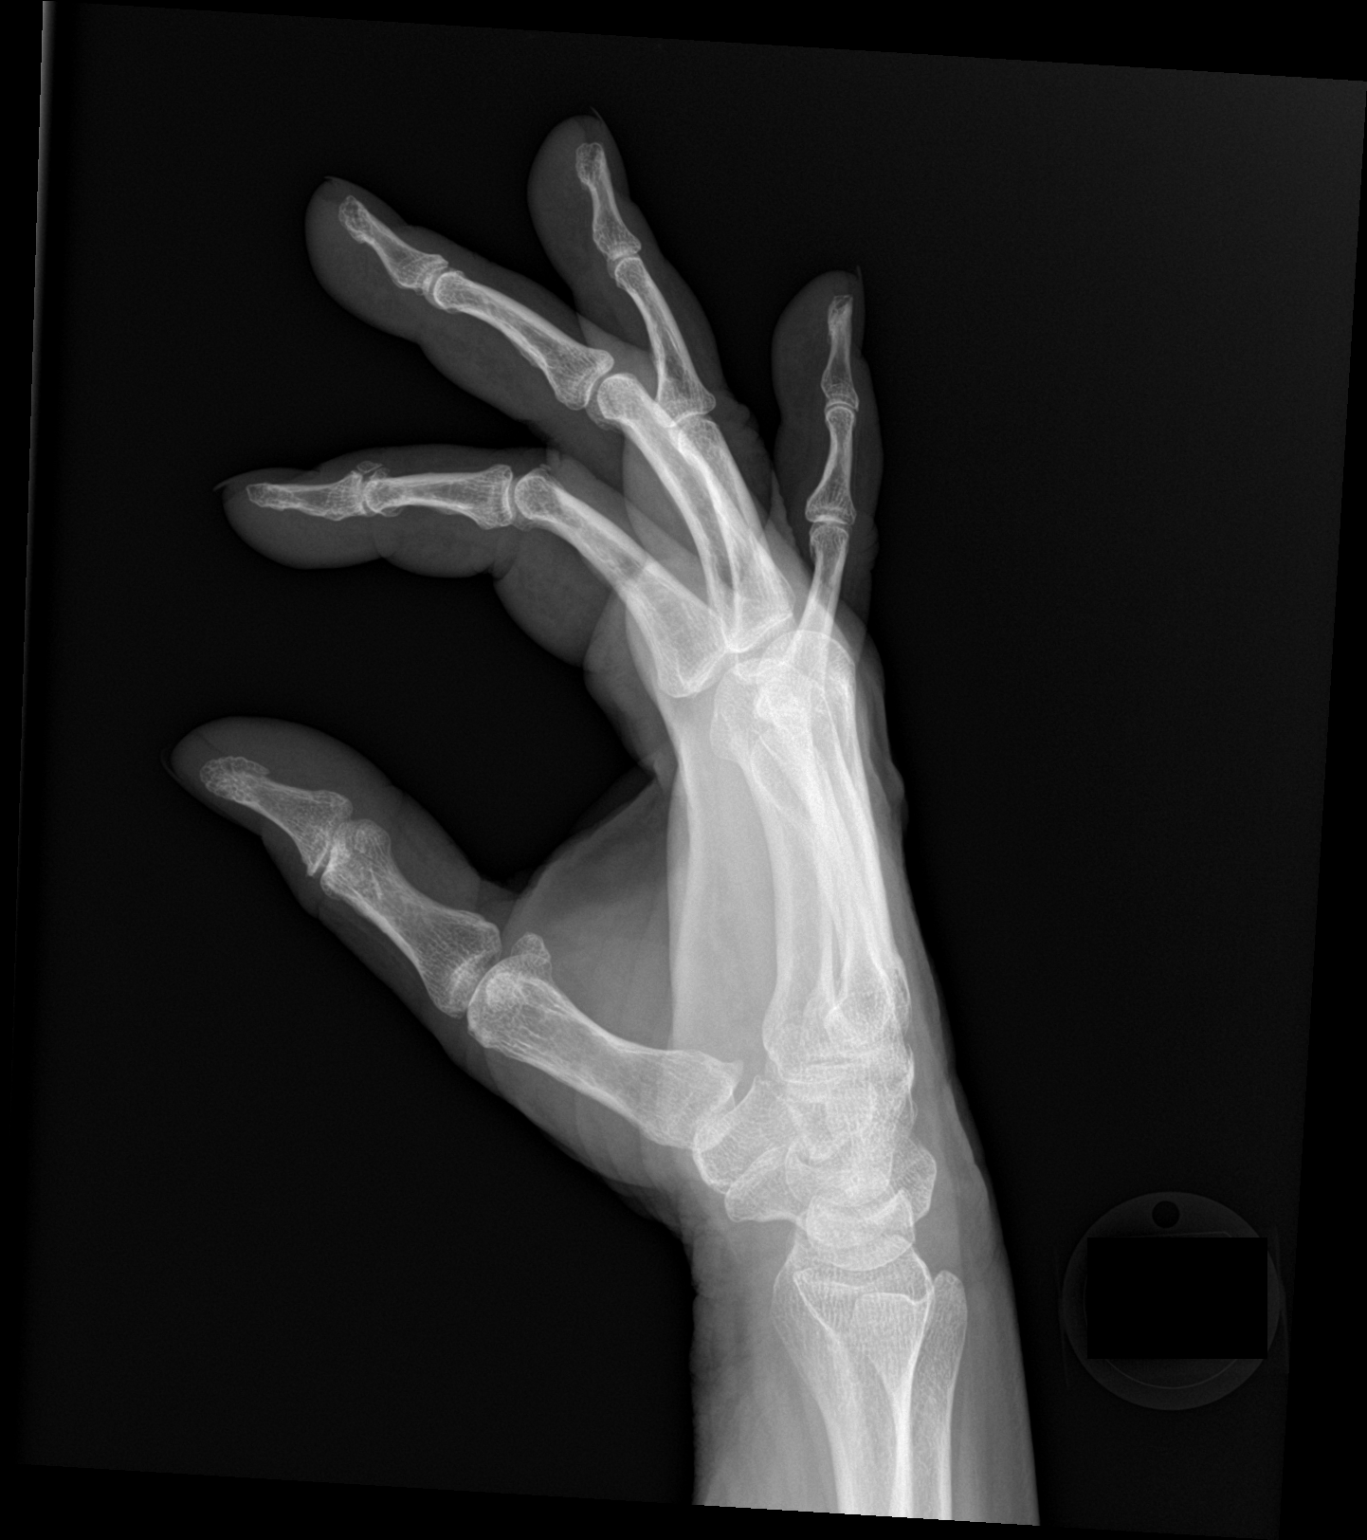

[3 of 3 positions shown; findings below may reference images not displayed]

FINDINGS: No fracture or dislocation is seen.

The joint spaces are preserved.

The visualized soft tissues are unremarkable.
IMPRESSION: Negative.

## 2017-06-18 ENCOUNTER — Encounter: Payer: Self-pay | Admitting: Family Medicine

## 2017-06-18 ENCOUNTER — Ambulatory Visit (INDEPENDENT_AMBULATORY_CARE_PROVIDER_SITE_OTHER): Payer: BC Managed Care – PPO | Admitting: Family Medicine

## 2017-06-18 VITALS — BP 128/84 | HR 88 | Temp 98.8°F | Ht 62.75 in | Wt 158.5 lb

## 2017-06-18 DIAGNOSIS — I10 Essential (primary) hypertension: Secondary | ICD-10-CM

## 2017-06-18 DIAGNOSIS — E559 Vitamin D deficiency, unspecified: Secondary | ICD-10-CM

## 2017-06-18 DIAGNOSIS — Z0000 Encounter for general adult medical examination without abnormal findings: Secondary | ICD-10-CM | POA: Diagnosis not present

## 2017-06-18 DIAGNOSIS — Z7189 Other specified counseling: Secondary | ICD-10-CM

## 2017-06-18 DIAGNOSIS — E7800 Pure hypercholesterolemia, unspecified: Secondary | ICD-10-CM

## 2017-06-18 LAB — COMPREHENSIVE METABOLIC PANEL
ALBUMIN: 4.6 g/dL (ref 3.5–5.2)
ALK PHOS: 60 U/L (ref 39–117)
ALT: 33 U/L (ref 0–35)
AST: 25 U/L (ref 0–37)
BILIRUBIN TOTAL: 0.4 mg/dL (ref 0.2–1.2)
BUN: 18 mg/dL (ref 6–23)
CHLORIDE: 100 meq/L (ref 96–112)
CO2: 32 mEq/L (ref 19–32)
CREATININE: 0.7 mg/dL (ref 0.40–1.20)
Calcium: 10.5 mg/dL (ref 8.4–10.5)
GFR: 89.4 mL/min (ref >60.00)
Glucose, Bld: 116 mg/dL — ABNORMAL HIGH (ref 70–99)
Potassium: 3.6 mEq/L (ref 3.5–5.1)
Sodium: 141 mEq/L (ref 135–145)
TOTAL PROTEIN: 7.8 g/dL (ref 6.0–8.3)

## 2017-06-18 LAB — LIPID PANEL
CHOLESTEROL: 219 mg/dL — AB (ref 0–200)
HDL: 32.2 mg/dL — ABNORMAL LOW (ref >39.00)
NonHDL: 187.19
Total CHOL/HDL Ratio: 7
Triglycerides: 211 mg/dL — ABNORMAL HIGH (ref 0.0–149.0)
VLDL: 42.2 mg/dL — ABNORMAL HIGH (ref 0.0–40.0)

## 2017-06-18 LAB — VITAMIN D 25 HYDROXY (VIT D DEFICIENCY, FRACTURES): VITD: 42.48 ng/mL (ref 30.00–100.00)

## 2017-06-18 LAB — LDL CHOLESTEROL, DIRECT: Direct LDL: 165 mg/dL

## 2017-06-18 MED ORDER — SIMVASTATIN 40 MG PO TABS: 40.0000 mg | ORAL_TABLET | Freq: Every day | ORAL | 3 refills | Status: DC

## 2017-06-18 MED ORDER — POTASSIUM CHLORIDE ER 10 MEQ PO TBCR: 10.0000 meq | EXTENDED_RELEASE_TABLET | Freq: Every day | ORAL | 3 refills | Status: DC

## 2017-06-18 MED ORDER — TRIAMTERENE-HCTZ 37.5-25 MG PO TABS: 1.0000 | ORAL_TABLET | Freq: Every day | ORAL | 3 refills | Status: DC

## 2017-06-18 NOTE — Patient Instructions (Addendum)
Check to see if cologuard is covered by insurance.   Don't change your meds for now.  Go to the lab on the way out.  We'll contact you with your lab report. Take care.  Glad to see you.

## 2017-06-18 NOTE — Progress Notes (Signed)
CPE- See plan.  Routine anticipatory guidance given to patient.  See health maintenance.  The possibility exists that previously documented standard health maintenance information may have been brought forward from a previous encounter into this note.  If needed, that same information has been updated to reflect the current situation based on today's encounter.    Tetanus 2014  Flu shot prev done Shingles shot d/w pt.  PNA shot not due yet.   D/w patient [id number] for colon cancer screening, including IFOB vs. colonoscopy. Risks and benefits of both were discussed and patient voiced understanding. Pt elects for: cologuard.  Mammogram encouraged, pending.  Pap 2018 Living will d/w pt. Would have Norvel Richards designated if she were incapacitated.  Diet and exercise encouraged. Doing "not really well" with either. She is trying to lose weight.  D/w pt.  she has been trying to monitor her steps.   Encouraged smoking cessation again.  HIV and HCV screening done 2017.   Due for f/u vit D.  D/w pt.  See notes on labs.    Hypertension:    Using medication without problems or lightheadedness: yes Chest pain with exertion:no Edema:no Short of breath:no Labs pending.   Elevated Cholesterol: Using medications without problems:yes Muscle aches: no Diet compliance:encouraged.  Exercise:encouraged Labs pending.   PMH and SH reviewed  Meds, vitals, and allergies reviewed.   ROS: Per HPI.  Unless specifically indicated otherwise in HPI, the patient denies:  General: fever. Eyes: acute vision changes ENT: sore throat Cardiovascular: chest pain Respiratory: SOB GI: vomiting GU: dysuria Musculoskeletal: acute back pain Derm: acute rash Neuro: acute motor dysfunction Psych: worsening mood Endocrine: polydipsia Heme: bleeding Allergy: hayfever  GEN: nad, alert and oriented HEENT: mucous membranes moist NECK: supple w/o LA CV: rrr. PULM: ctab, no inc wob ABD: soft, +bs EXT: no  edema SKIN: no acute rash

## 2017-06-20 NOTE — Assessment & Plan Note (Signed)
Living will d/w pt. Would have Selena Beasley designated if she were incapacitated.

## 2017-06-20 NOTE — Assessment & Plan Note (Signed)
Due for f/u vit D.  D/w pt.  See notes on labs.

## 2017-06-20 NOTE — Assessment & Plan Note (Signed)
Tetanus 2014  Flu shot prev done Shingles shot d/w pt.  PNA shot not due yet.   D/w patient [id number] for colon cancer screening, including IFOB vs. colonoscopy. Risks and benefits of both were discussed and patient voiced understanding. Pt elects for: cologuard.  Mammogram encouraged, pending.  Pap 2018 Living will d/w pt. Would have Norvel Richards designated if she were incapacitated.  Diet and exercise encouraged. Doing "not really well" with either. She is trying to lose weight.  D/w pt.  she has been trying to monitor her steps.   Encouraged smoking cessation again.  HIV and HCV screening done 2017.

## 2017-06-20 NOTE — Assessment & Plan Note (Signed)
See notes on labs.  Discussed with patient about diet and exercise.  No change in meds at this point.  She agrees.

## 2017-06-21 ENCOUNTER — Encounter: Payer: Self-pay | Admitting: *Deleted

## 2017-06-21 ENCOUNTER — Encounter: Payer: Self-pay | Admitting: Family Medicine

## 2017-06-22 ENCOUNTER — Other Ambulatory Visit: Payer: Self-pay | Admitting: Family Medicine

## 2017-06-22 DIAGNOSIS — Z1211 Encounter for screening for malignant neoplasm of colon: Secondary | ICD-10-CM

## 2017-07-02 ENCOUNTER — Other Ambulatory Visit (INDEPENDENT_AMBULATORY_CARE_PROVIDER_SITE_OTHER): Payer: BC Managed Care – PPO

## 2017-07-02 DIAGNOSIS — Z1211 Encounter for screening for malignant neoplasm of colon: Secondary | ICD-10-CM

## 2017-07-02 LAB — FECAL OCCULT BLOOD, IMMUNOCHEMICAL: Fecal Occult Bld: NEGATIVE

## 2017-07-05 ENCOUNTER — Encounter: Payer: Self-pay | Admitting: *Deleted

## 2017-07-07 ENCOUNTER — Other Ambulatory Visit: Payer: Self-pay | Admitting: Family Medicine

## 2017-07-08 LAB — HM MAMMOGRAPHY

## 2017-07-13 ENCOUNTER — Encounter: Payer: Self-pay | Admitting: Family Medicine

## 2017-12-06 ENCOUNTER — Encounter: Payer: Self-pay | Admitting: Family Medicine

## 2017-12-06 ENCOUNTER — Other Ambulatory Visit: Payer: Self-pay | Admitting: *Deleted

## 2017-12-06 MED ORDER — SIMVASTATIN 40 MG PO TABS: 40.0000 mg | ORAL_TABLET | Freq: Every day | ORAL | 2 refills | Status: DC

## 2017-12-06 MED ORDER — TRIAMTERENE-HCTZ 37.5-25 MG PO TABS: 1.0000 | ORAL_TABLET | Freq: Every day | ORAL | 2 refills | Status: DC

## 2017-12-06 MED ORDER — POTASSIUM CHLORIDE ER 10 MEQ PO TBCR: 10.0000 meq | EXTENDED_RELEASE_TABLET | Freq: Every day | ORAL | 2 refills | Status: DC

## 2018-06-21 ENCOUNTER — Other Ambulatory Visit: Payer: Self-pay | Admitting: Family Medicine

## 2018-06-21 DIAGNOSIS — E559 Vitamin D deficiency, unspecified: Secondary | ICD-10-CM

## 2018-06-23 ENCOUNTER — Other Ambulatory Visit (INDEPENDENT_AMBULATORY_CARE_PROVIDER_SITE_OTHER): Payer: Medicare HMO

## 2018-06-23 ENCOUNTER — Encounter: Payer: BC Managed Care – PPO | Admitting: Family Medicine

## 2018-06-23 DIAGNOSIS — E559 Vitamin D deficiency, unspecified: Secondary | ICD-10-CM | POA: Diagnosis not present

## 2018-06-23 LAB — COMPREHENSIVE METABOLIC PANEL
ALT: 16 U/L (ref 0–35)
AST: 19 U/L (ref 0–37)
Albumin: 4.3 g/dL (ref 3.5–5.2)
Alkaline Phosphatase: 50 U/L (ref 39–117)
BUN: 16 mg/dL (ref 6–23)
CO2: 31 mEq/L (ref 19–32)
Calcium: 9.4 mg/dL (ref 8.4–10.5)
Chloride: 104 mEq/L (ref 96–112)
Creatinine, Ser: 0.61 mg/dL (ref 0.40–1.20)
GFR: 98.28 mL/min (ref >60.00)
Glucose, Bld: 106 mg/dL — ABNORMAL HIGH (ref 70–99)
Potassium: 3.9 mEq/L (ref 3.5–5.1)
Sodium: 143 mEq/L (ref 135–145)
Total Bilirubin: 0.5 mg/dL (ref 0.2–1.2)
Total Protein: 6.9 g/dL (ref 6.0–8.3)

## 2018-06-23 LAB — LIPID PANEL
Cholesterol: 178 mg/dL (ref 0–200)
HDL: 42.2 mg/dL (ref >39.00)
LDL Cholesterol: 104 mg/dL — ABNORMAL HIGH (ref 0–99)
NonHDL: 135.73
Total CHOL/HDL Ratio: 4
Triglycerides: 158 mg/dL — ABNORMAL HIGH (ref 0.0–149.0)
VLDL: 31.6 mg/dL (ref 0.0–40.0)

## 2018-06-23 LAB — VITAMIN D 25 HYDROXY (VIT D DEFICIENCY, FRACTURES): VITD: 44.8 ng/mL (ref 30.00–100.00)

## 2018-06-24 ENCOUNTER — Ambulatory Visit (INDEPENDENT_AMBULATORY_CARE_PROVIDER_SITE_OTHER): Payer: Medicare HMO | Admitting: Family Medicine

## 2018-06-24 ENCOUNTER — Other Ambulatory Visit: Payer: BC Managed Care – PPO

## 2018-06-24 ENCOUNTER — Encounter: Payer: Self-pay | Admitting: Family Medicine

## 2018-06-24 DIAGNOSIS — Z7189 Other specified counseling: Secondary | ICD-10-CM | POA: Diagnosis not present

## 2018-06-24 DIAGNOSIS — E7800 Pure hypercholesterolemia, unspecified: Secondary | ICD-10-CM | POA: Diagnosis not present

## 2018-06-24 DIAGNOSIS — I10 Essential (primary) hypertension: Secondary | ICD-10-CM | POA: Diagnosis not present

## 2018-06-24 DIAGNOSIS — Z0000 Encounter for general adult medical examination without abnormal findings: Secondary | ICD-10-CM

## 2018-06-24 MED ORDER — POTASSIUM CHLORIDE ER 10 MEQ PO TBCR: 10.0000 meq | EXTENDED_RELEASE_TABLET | Freq: Every day | ORAL | 3 refills | Status: DC

## 2018-06-24 MED ORDER — SIMVASTATIN 40 MG PO TABS: 40.0000 mg | ORAL_TABLET | Freq: Every day | ORAL | 3 refills | Status: DC

## 2018-06-24 MED ORDER — TRIAMTERENE-HCTZ 37.5-25 MG PO TABS: 1.0000 | ORAL_TABLET | Freq: Every day | ORAL | 3 refills | Status: DC

## 2018-06-24 NOTE — Progress Notes (Signed)
Virtual visit completed through WebEx or similar program Patient location: home  Provider location: [place] at [facility], office   Limitations and rationale for visit method d/w patient.  Patient agreed to proceed.   CC: follow up.    HPI:  Welcome to medicare visit tabled given video visit.    Elevated Cholesterol: Using medications without problems:yes Muscle aches: no Diet compliance: encouraged.  Exercise: encouraged.   Intentional weight loss in the last year.  Hypertension:    Using medication without problems or lightheadedness: yes Chest pain with exertion:no Edema:no Short of breath:no Labs d/w pt.   She can update me about her BP if needed.    She quit smoking prev.   Pandemic considerations d/w pt.  Her partner has prostate cancer and is going through radiation.  Stressors d/w pt.   Tetanus 2014 Flu due fall 2020 PNA due when possible, d/w pt.  Shingles out of stock, d/w pt.  D/w patient [id number] for colon cancer screening, including IFOB vs. Colonoscopy vs cologuard.  Risks and benefits of both were discussed and patient voiced understanding.  Pt elects to consider options and let me know.  Mammogram f/u pending.   Pap not due with age >25.   DXA- due when possible.  She can update me when she wants to go.   Living will d/w pt. Would have Norvel Richards designated if she were incapacitated.  Diet and exercise d/w pt.   Mood is good.  No falls.   No memory deficits per patient report.    Meds and allergies reviewed.   ROS: Per HPI unless specifically indicated in ROS section   NAD Speech wnl  A/P:   Elevated Cholesterol: Lipids improved.  Continue statin.  Labs discussed with patient. Intentional weight loss in the last year.  Hypertension:    No change in meds. Labs d/w pt.   She can update me about her BP if needed.    Health maintenance. Tetanus 2014 Flu due fall 2020 PNA due when possible, d/w pt.  Shingles out of stock, d/w pt.   D/w patient [id number] for colon cancer screening, including IFOB vs. Colonoscopy vs cologuard.  Risks and benefits of both were discussed and patient voiced understanding.  Pt elects to consider options and let me know.  Mammogram f/u pending.   Pap not due with age >80.   DXA- due when possible.  She can update me when she wants to go.   Living will d/w pt. Would have Norvel Richards designated if she were incapacitated.  Diet and exercise d/w pt.   >25 minutes spent in face to face time with patient via video conference, >50% spent in counselling or coordination of care

## 2018-06-27 DIAGNOSIS — Z0000 Encounter for general adult medical examination without abnormal findings: Secondary | ICD-10-CM | POA: Insufficient documentation

## 2018-06-27 NOTE — Assessment & Plan Note (Signed)
Tetanus 2014 Flu due fall 2020 PNA due when possible, d/w pt.  Shingles out of stock, d/w pt.  D/w patient [id number] for colon cancer screening, including IFOB vs. Colonoscopy vs cologuard.  Risks and benefits of both were discussed and patient voiced understanding.  Pt elects to consider options and let me know.  Mammogram f/u pending.   Pap not due with age >75.   DXA- due when possible.  She can update me when she wants to go.    Living will d/w pt. Would have Norvel Richards designated if she were incapacitated.  Diet and exercise d/w pt.

## 2018-06-27 NOTE — Assessment & Plan Note (Signed)
Living will d/w pt. Would have Norvel Richards designated if she were incapacitated.

## 2018-06-27 NOTE — Assessment & Plan Note (Signed)
  Hypertension:    No change in meds. Labs d/w pt.   She can update me about her BP if needed.

## 2018-06-27 NOTE — Assessment & Plan Note (Signed)
Elevated Cholesterol: Lipids improved.  Continue statin.  Labs discussed with patient. Intentional weight loss in the last year.

## 2018-07-11 DIAGNOSIS — Z1231 Encounter for screening mammogram for malignant neoplasm of breast: Secondary | ICD-10-CM | POA: Diagnosis not present

## 2018-07-11 LAB — HM MAMMOGRAPHY

## 2018-08-23 ENCOUNTER — Encounter: Payer: Self-pay | Admitting: Family Medicine

## 2018-10-11 DIAGNOSIS — R69 Illness, unspecified: Secondary | ICD-10-CM | POA: Diagnosis not present

## 2018-10-26 ENCOUNTER — Encounter: Payer: Self-pay | Admitting: Family Medicine

## 2019-01-31 DIAGNOSIS — E785 Hyperlipidemia, unspecified: Secondary | ICD-10-CM | POA: Diagnosis not present

## 2019-01-31 DIAGNOSIS — Z87891 Personal history of nicotine dependence: Secondary | ICD-10-CM | POA: Diagnosis not present

## 2019-01-31 DIAGNOSIS — R69 Illness, unspecified: Secondary | ICD-10-CM | POA: Diagnosis not present

## 2019-01-31 DIAGNOSIS — I10 Essential (primary) hypertension: Secondary | ICD-10-CM | POA: Diagnosis not present

## 2019-01-31 DIAGNOSIS — Z8249 Family history of ischemic heart disease and other diseases of the circulatory system: Secondary | ICD-10-CM | POA: Diagnosis not present

## 2019-01-31 DIAGNOSIS — Z7722 Contact with and (suspected) exposure to environmental tobacco smoke (acute) (chronic): Secondary | ICD-10-CM | POA: Diagnosis not present

## 2019-01-31 DIAGNOSIS — Z833 Family history of diabetes mellitus: Secondary | ICD-10-CM | POA: Diagnosis not present

## 2019-01-31 DIAGNOSIS — Z803 Family history of malignant neoplasm of breast: Secondary | ICD-10-CM | POA: Diagnosis not present

## 2019-01-31 DIAGNOSIS — Z888 Allergy status to other drugs, medicaments and biological substances status: Secondary | ICD-10-CM | POA: Diagnosis not present

## 2019-04-26 DIAGNOSIS — Z0100 Encounter for examination of eyes and vision without abnormal findings: Secondary | ICD-10-CM | POA: Diagnosis not present

## 2019-04-26 DIAGNOSIS — H3540 Unspecified peripheral retinal degeneration: Secondary | ICD-10-CM | POA: Diagnosis not present

## 2019-04-26 DIAGNOSIS — H5203 Hypermetropia, bilateral: Secondary | ICD-10-CM | POA: Diagnosis not present

## 2019-06-06 ENCOUNTER — Other Ambulatory Visit: Payer: Self-pay | Admitting: Family Medicine

## 2019-06-26 ENCOUNTER — Ambulatory Visit (INDEPENDENT_AMBULATORY_CARE_PROVIDER_SITE_OTHER): Payer: Medicare HMO | Admitting: Family Medicine

## 2019-06-26 ENCOUNTER — Telehealth: Payer: Self-pay

## 2019-06-26 ENCOUNTER — Other Ambulatory Visit: Payer: Self-pay

## 2019-06-26 ENCOUNTER — Encounter: Payer: Self-pay | Admitting: Family Medicine

## 2019-06-26 VITALS — BP 140/80 | HR 99 | Temp 97.2°F | Ht 62.75 in | Wt 149.2 lb

## 2019-06-26 DIAGNOSIS — Z1211 Encounter for screening for malignant neoplasm of colon: Secondary | ICD-10-CM | POA: Diagnosis not present

## 2019-06-26 DIAGNOSIS — Z7189 Other specified counseling: Secondary | ICD-10-CM

## 2019-06-26 DIAGNOSIS — Z0000 Encounter for general adult medical examination without abnormal findings: Secondary | ICD-10-CM | POA: Diagnosis not present

## 2019-06-26 DIAGNOSIS — E559 Vitamin D deficiency, unspecified: Secondary | ICD-10-CM

## 2019-06-26 DIAGNOSIS — Z780 Asymptomatic menopausal state: Secondary | ICD-10-CM

## 2019-06-26 DIAGNOSIS — E7800 Pure hypercholesterolemia, unspecified: Secondary | ICD-10-CM

## 2019-06-26 DIAGNOSIS — I10 Essential (primary) hypertension: Secondary | ICD-10-CM

## 2019-06-26 LAB — LIPID PANEL
Cholesterol: 191 mg/dL (ref 0–200)
HDL: 38 mg/dL — ABNORMAL LOW (ref >39.00)
LDL Cholesterol: 121 mg/dL — ABNORMAL HIGH (ref 0–99)
NonHDL: 152.95
Total CHOL/HDL Ratio: 5
Triglycerides: 162 mg/dL — ABNORMAL HIGH (ref 0.0–149.0)
VLDL: 32.4 mg/dL (ref 0.0–40.0)

## 2019-06-26 LAB — COMPREHENSIVE METABOLIC PANEL
ALT: 19 U/L (ref 0–35)
AST: 20 U/L (ref 0–37)
Albumin: 4.6 g/dL (ref 3.5–5.2)
Alkaline Phosphatase: 56 U/L (ref 39–117)
BUN: 17 mg/dL (ref 6–23)
CO2: 30 mEq/L (ref 19–32)
Calcium: 9.4 mg/dL (ref 8.4–10.5)
Chloride: 102 mEq/L (ref 96–112)
Creatinine, Ser: 0.6 mg/dL (ref 0.40–1.20)
GFR: 99.87 mL/min (ref >60.00)
Glucose, Bld: 115 mg/dL — ABNORMAL HIGH (ref 70–99)
Potassium: 3.6 mEq/L (ref 3.5–5.1)
Sodium: 141 mEq/L (ref 135–145)
Total Bilirubin: 0.4 mg/dL (ref 0.2–1.2)
Total Protein: 7.1 g/dL (ref 6.0–8.3)

## 2019-06-26 LAB — VITAMIN D 25 HYDROXY (VIT D DEFICIENCY, FRACTURES): VITD: 107.88 ng/mL (ref 30.00–100.00)

## 2019-06-26 MED ORDER — POTASSIUM CHLORIDE ER 10 MEQ PO TBCR: 10.0000 meq | EXTENDED_RELEASE_TABLET | Freq: Every day | ORAL | 3 refills | Status: DC

## 2019-06-26 MED ORDER — SIMVASTATIN 40 MG PO TABS: 40.0000 mg | ORAL_TABLET | Freq: Every day | ORAL | 3 refills | Status: DC

## 2019-06-26 MED ORDER — TRIAMTERENE-HCTZ 37.5-25 MG PO TABS: 1.0000 | ORAL_TABLET | Freq: Every day | ORAL | 3 refills | Status: DC

## 2019-06-26 NOTE — Progress Notes (Addendum)
This visit occurred during the SARS-CoV-2 public health emergency.  Safety protocols were in place, including screening questions prior to the visit, additional usage of staff PPE, and extensive cleaning of exam room while observing appropriate contact time as indicated for disinfecting solutions.  I have personally reviewed the Medicare Annual Wellness questionnaire and have noted 1. The patient's medical and social history 2. Their use of alcohol, tobacco or illicit drugs 3. Their current medications and supplements 4. The patient's functional ability including ADL's, fall risks, home safety risks and hearing or visual             impairment. 5. Diet and physical activities 6. Evidence for depression or mood disorders  The patients weight, height, BMI have been recorded in the chart and visual acuity is per eye clinic.  I have made referrals, counseling and provided education to the patient based review of the above and I have provided the pt with a written personalized care plan for preventive services.  Provider list updated- see scanned forms.  Routine anticipatory guidance given to patient.  See health maintenance. The possibility exists that previously documented standard health maintenance information may have been brought forward from a previous encounter into this note.  If needed, that same information has been updated to reflect the current situation based on today's encounter.    History of vitamin D deficiency.  See notes on labs.  Tetanus 2014  Flu shot prev done Shingles shot d/w pt.  PNA shot d/w pt.   covid vaccine d/w pt.   D/w patient [id number] for colon cancer screening, including IFOB vs. colonoscopy. Risks and benefits of both were discussed and patient voiced understanding. Pt elects for: cologuard.  Mammogram encouraged, pending.  DXA ordered 2021 Pap 2018 Living will d/w pt. Would have Norvel Richards designated if she were incapacitated.  Diet and exercise  encouraged. Encouraged smoking cessation again.  She is working on cessation.   HIV and HCV screening done 2017.  She passed whisper hearing screening.    Hypertension:    Using medication without problems or lightheadedness: yes Chest pain with exertion:no Edema:no Short of breath:no  Elevated Cholesterol: Using medications without problems:yes Muscle aches: no Diet compliance: encouraged.   Exercise:encouraged Labs pending.   PMH and SH reviewed  Meds, vitals, and allergies reviewed.   ROS: Per HPI.  Unless specifically indicated otherwise in HPI, the patient denies:  General: fever. Eyes: acute vision changes ENT: sore throat Cardiovascular: chest pain Respiratory: SOB GI: vomiting GU: dysuria Musculoskeletal: acute back pain Derm: acute rash Neuro: acute motor dysfunction Psych: worsening mood Endocrine: polydipsia Heme: bleeding Allergy: hayfever  GEN: nad, alert and oriented HEENT: ncat NECK: supple w/o LA CV: rrr. PULM: ctab, no inc wob ABD: soft, +bs EXT: no edema SKIN: no acute rash Skin tags noted on the neck.  Benign SK on the R arm.    EKG reviewed.   Sinus  Rhythm  Low voltage in limb leads.  No acute changes.

## 2019-06-26 NOTE — Patient Instructions (Addendum)
Go to the lab on the way out.   If you have mychart we'll likely use that to update you.    EKG and vision screen on the way out.  I would get a covid vaccine whenever possible.  I'll work on the bone density and cologuard.   Take care.  Glad to see you.

## 2019-06-26 NOTE — Telephone Encounter (Signed)
[facility] called critical results @ 1510  Vit D 107.88

## 2019-06-27 NOTE — Telephone Encounter (Signed)
See result note.  Patient feeling well and no sx related to this.

## 2019-06-29 ENCOUNTER — Other Ambulatory Visit: Payer: Self-pay | Admitting: Family Medicine

## 2019-06-29 DIAGNOSIS — Z0000 Encounter for general adult medical examination without abnormal findings: Secondary | ICD-10-CM | POA: Insufficient documentation

## 2019-06-29 MED ORDER — VITAMIN D3 25 MCG (1000 UNIT) PO TABS: 1000.0000 [IU] | ORAL_TABLET | Freq: Every day | ORAL | Status: DC

## 2019-06-29 NOTE — Assessment & Plan Note (Signed)
Continue triamterene hydrochlorothiazide and potassium.  See notes on labs.  Continue work on diet and exercise.

## 2019-06-29 NOTE — Assessment & Plan Note (Signed)
See notes on labs.  Continue simvastatin for now.

## 2019-06-29 NOTE — Assessment & Plan Note (Signed)
Living will d/w pt. Would have Norvel Richards designated if she were incapacitated.

## 2019-06-29 NOTE — Assessment & Plan Note (Signed)
  Tetanus 2014  Flu shot prev done Shingles shot d/w pt.  PNA shot d/w pt.   covid vaccine d/w pt.   D/w patient [id number] for colon cancer screening, including IFOB vs. colonoscopy. Risks and benefits of both were discussed and patient voiced understanding. Pt elects for: cologuard.  Mammogram encouraged, pending.  DXA ordered 2021 Pap 2018 Living will d/w pt. Would have Selena Beasley designated if she were incapacitated.  Diet and exercise encouraged. Encouraged smoking cessation again.  She is working on cessation.   HIV and HCV screening done 2017.  She passed whisper hearing screening.

## 2019-07-11 NOTE — Addendum Note (Signed)
Addended by: Josetta Huddle on: 07/11/2019 04:52 PM   Modules accepted: Orders

## 2019-07-13 DIAGNOSIS — Z1231 Encounter for screening mammogram for malignant neoplasm of breast: Secondary | ICD-10-CM | POA: Diagnosis not present

## 2019-07-13 LAB — HM MAMMOGRAPHY

## 2019-07-17 DIAGNOSIS — Z1211 Encounter for screening for malignant neoplasm of colon: Secondary | ICD-10-CM | POA: Diagnosis not present

## 2019-07-17 LAB — COLOGUARD: Cologuard: POSITIVE — AB

## 2019-07-21 ENCOUNTER — Telehealth: Payer: Self-pay

## 2019-07-21 NOTE — Telephone Encounter (Signed)
Heather called to see if [facility] received + cologuard result on pt. Heather request cb so can document if was received and if not can resend results. Pt # [id number].

## 2019-07-21 NOTE — Telephone Encounter (Signed)
Results were received.

## 2019-07-24 ENCOUNTER — Other Ambulatory Visit: Payer: Self-pay | Admitting: Family Medicine

## 2019-07-24 DIAGNOSIS — R195 Other fecal abnormalities: Secondary | ICD-10-CM

## 2019-08-17 ENCOUNTER — Encounter: Payer: Self-pay | Admitting: Family Medicine

## 2019-09-28 ENCOUNTER — Other Ambulatory Visit: Payer: Self-pay

## 2019-09-28 ENCOUNTER — Ambulatory Visit (INDEPENDENT_AMBULATORY_CARE_PROVIDER_SITE_OTHER): Payer: Medicare HMO | Admitting: Gastroenterology

## 2019-09-28 ENCOUNTER — Encounter: Payer: Self-pay | Admitting: Gastroenterology

## 2019-09-28 VITALS — BP 165/71 | HR 80 | Ht 62.75 in | Wt 149.6 lb

## 2019-09-28 DIAGNOSIS — R195 Other fecal abnormalities: Secondary | ICD-10-CM | POA: Diagnosis not present

## 2019-09-28 NOTE — Progress Notes (Signed)
Gastroenterology Consultation  Referring Provider:     Tonia Ghent, MD Primary Care Physician:  Tonia Ghent, MD Primary Gastroenterologist:  Dr. Allen Norris     Reason for Consultation:     Positive Cologuard        HPI:   Dusty Wagoner Edgerly is a 69 y.o. y/o female referred for consultation & management of positive Cologuard by Dr. Damita Dunnings, Elveria Rising, MD.  This patient comes in to see me today with a history of having a positive Cologuard test.  This test was sent off by her primary care provider for a screening for colon cancer. The patient is quite nervous about the findings of her cologuard.  The patient states that she has been going for regular Hemoccult stool test may have been negative therefore she was surprised that she had this abnormal test.  The patient denies any change in bowel habits black stools bloody stools nausea vomiting or abdominal pain.  Past Medical History:  Diagnosis Date  . HTN (hypertension)   . Hyperlipidemia     Past Surgical History:  Procedure Laterality Date  . NSVD     x3 (Last 1995)    Prior to Admission medications   Medication Sig Start Date End Date Taking? Authorizing Provider  cholecalciferol (VITAMIN D) 25 MCG (1000 UNIT) tablet Take 1 tablet (1,000 Units total) by mouth daily. 06/29/19   Tonia Ghent, MD  loratadine (CLARITIN) 10 MG tablet Take 10 mg by mouth daily.    [provider]  Multiple Vitamin (MULTIVITAMIN) tablet Take 1 tablet by mouth daily.      [provider]  potassium chloride (KLOR-CON) 10 MEQ tablet Take 1 tablet (10 mEq total) by mouth daily. 06/26/19   Tonia Ghent, MD  simvastatin (ZOCOR) 40 MG tablet Take 1 tablet (40 mg total) by mouth at bedtime. 06/26/19   Tonia Ghent, MD  triamterene-hydrochlorothiazide (MAXZIDE-25) 37.5-25 MG tablet Take 1 tablet by mouth daily. 06/26/19   Tonia Ghent, MD    Family History  Problem Relation Age of Onset  . Heart attack Father 63        deceased from same  . Heart disease Father   . Alcohol abuse Father   . Hypertension Mother   . Cancer Mother        breast  . Breast cancer Mother   . Diabetes Brother   . Diabetes Maternal Grandmother   . Colon cancer Neg Hx      Social History   Tobacco Use  . Smoking status: Former Smoker    Packs/day: 0.25    Years: 48.00    Pack years: 12.00    Types: Cigarettes  . Smokeless tobacco: Never Used  Vaping Use  . Vaping Use: Never used  Substance Use Topics  . Alcohol use: No  . Drug use: No    Allergies as of 09/28/2019 - Review Complete 06/26/2019  Allergen Reaction Noted  . Neomycin  07/07/2016  . Polymyxin b Other (See Comments) 06/10/2015  . Zostavax [zoster vaccine live]  07/07/2016    Review of Systems:    All systems reviewed and negative except where noted in HPI.   Physical Exam:  There were no vitals taken for this visit. No LMP recorded. Patient is postmenopausal. General:   Alert,  Well-developed, well-nourished, pleasant and cooperative in NAD Head:  Normocephalic and atraumatic. Eyes:  Sclera clear, no icterus.   Conjunctiva pink. Ears:  Normal auditory acuity. Neck:  Supple; no masses or thyromegaly. Lungs:  Respirations even and unlabored.  Clear throughout to auscultation.   No wheezes, crackles, or rhonchi. No acute distress. Heart:  Regular rate and rhythm; no murmurs, clicks, rubs, or gallops. Abdomen:  Normal bowel sounds.  No bruits.  Soft, non-tender and non-distended without masses, hepatosplenomegaly or hernias noted.  No guarding or rebound tenderness.  Negative Carnett sign.   Rectal:  Deferred.  Pulses:  Normal pulses noted. Extremities:  No clubbing or edema.  No cyanosis. Neurologic:  Alert and oriented x3;  grossly normal neurologically. Skin:  Intact without significant lesions or rashes.  No jaundice. Lymph Nodes:  No significant cervical adenopathy. Psych:  Alert and cooperative. Normal mood and affect.  Imaging  Studies: No results found.  Assessment and Plan:   LENISE JR is a 69 y.o. y/o female Who comes in today with a positive cologuard.  The patient was concerned about having a colonoscopy after having Hemoccult cards done for screening for many years.  The patient was then told to do a cologuard which came back positive.  She now understands the importance of going through the colonoscopy and that she will be set up for the next available appointment that is good for her.  The patient has been explained the plan and agrees with it.    Lucilla Lame, MD. Marval Regal    Note: This dictation was prepared with Dragon dictation along with smaller phrase technology. Any transcriptional errors that result from this process are unintentional.

## 2019-09-28 NOTE — H&P (View-Only) (Signed)
Gastroenterology Consultation  Referring Provider:     Tonia Ghent, MD Primary Care Physician:  Tonia Ghent, MD Primary Gastroenterologist:  Dr. Allen Norris     Reason for Consultation:     Positive Cologuard        HPI:   Selena Beasley is a 69 y.o. y/o female referred for consultation & management of positive Cologuard by Dr. Damita Dunnings, Elveria Rising, MD.  This patient comes in to see me today with a history of having a positive Cologuard test.  This test was sent off by her primary care provider for a screening for colon cancer. The patient is quite nervous about the findings of her cologuard.  The patient states that she has been going for regular Hemoccult stool test may have been negative therefore she was surprised that she had this abnormal test.  The patient denies any change in bowel habits black stools bloody stools nausea vomiting or abdominal pain.  Past Medical History:  Diagnosis Date  . HTN (hypertension)   . Hyperlipidemia     Past Surgical History:  Procedure Laterality Date  . NSVD     x3 (Last 1995)    Prior to Admission medications   Medication Sig Start Date End Date Taking? Authorizing Provider  cholecalciferol (VITAMIN D) 25 MCG (1000 UNIT) tablet Take 1 tablet (1,000 Units total) by mouth daily. 06/29/19   Tonia Ghent, MD  loratadine (CLARITIN) 10 MG tablet Take 10 mg by mouth daily.    [provider]  Multiple Vitamin (MULTIVITAMIN) tablet Take 1 tablet by mouth daily.      [provider]  potassium chloride (KLOR-CON) 10 MEQ tablet Take 1 tablet (10 mEq total) by mouth daily. 06/26/19   Tonia Ghent, MD  simvastatin (ZOCOR) 40 MG tablet Take 1 tablet (40 mg total) by mouth at bedtime. 06/26/19   Tonia Ghent, MD  triamterene-hydrochlorothiazide (MAXZIDE-25) 37.5-25 MG tablet Take 1 tablet by mouth daily. 06/26/19   Tonia Ghent, MD    Family History  Problem Relation Age of Onset  . Heart attack Father 19        deceased from same  . Heart disease Father   . Alcohol abuse Father   . Hypertension Mother   . Cancer Mother        breast  . Breast cancer Mother   . Diabetes Brother   . Diabetes Maternal Grandmother   . Colon cancer Neg Hx      Social History   Tobacco Use  . Smoking status: Former Smoker    Packs/day: 0.25    Years: 48.00    Pack years: 12.00    Types: Cigarettes  . Smokeless tobacco: Never Used  Vaping Use  . Vaping Use: Never used  Substance Use Topics  . Alcohol use: No  . Drug use: No    Allergies as of 09/28/2019 - Review Complete 06/26/2019  Allergen Reaction Noted  . Neomycin  07/07/2016  . Polymyxin b Other (See Comments) 06/10/2015  . Zostavax [zoster vaccine live]  07/07/2016    Review of Systems:    All systems reviewed and negative except where noted in HPI.   Physical Exam:  There were no vitals taken for this visit. No LMP recorded. Patient is postmenopausal. General:   Alert,  Well-developed, well-nourished, pleasant and cooperative in NAD Head:  Normocephalic and atraumatic. Eyes:  Sclera clear, no icterus.   Conjunctiva pink. Ears:  Normal auditory acuity. Neck:  Supple; no masses or thyromegaly. Lungs:  Respirations even and unlabored.  Clear throughout to auscultation.   No wheezes, crackles, or rhonchi. No acute distress. Heart:  Regular rate and rhythm; no murmurs, clicks, rubs, or gallops. Abdomen:  Normal bowel sounds.  No bruits.  Soft, non-tender and non-distended without masses, hepatosplenomegaly or hernias noted.  No guarding or rebound tenderness.  Negative Carnett sign.   Rectal:  Deferred.  Pulses:  Normal pulses noted. Extremities:  No clubbing or edema.  No cyanosis. Neurologic:  Alert and oriented x3;  grossly normal neurologically. Skin:  Intact without significant lesions or rashes.  No jaundice. Lymph Nodes:  No significant cervical adenopathy. Psych:  Alert and cooperative. Normal mood and affect.  Imaging  Studies: No results found.  Assessment and Plan:   Selena Beasley is a 69 y.o. y/o female Who comes in today with a positive cologuard.  The patient was concerned about having a colonoscopy after having Hemoccult cards done for screening for many years.  The patient was then told to do a cologuard which came back positive.  She now understands the importance of going through the colonoscopy and that she will be set up for the next available appointment that is good for her.  The patient has been explained the plan and agrees with it.    Lucilla Lame, MD. Marval Regal    Note: This dictation was prepared with Dragon dictation along with smaller phrase technology. Any transcriptional errors that result from this process are unintentional.

## 2019-10-02 ENCOUNTER — Encounter: Payer: Self-pay | Admitting: Gastroenterology

## 2019-10-02 ENCOUNTER — Other Ambulatory Visit: Payer: Self-pay

## 2019-10-03 ENCOUNTER — Other Ambulatory Visit
Admission: RE | Admit: 2019-10-03 | Discharge: 2019-10-03 | Disposition: A | Discharge status: Home / Self Care | Payer: Medicare HMO | Source: Ambulatory Visit | Attending: Gastroenterology | Admitting: Gastroenterology

## 2019-10-03 DIAGNOSIS — Z20822 Contact with and (suspected) exposure to covid-19: Secondary | ICD-10-CM | POA: Insufficient documentation

## 2019-10-03 DIAGNOSIS — Z01812 Encounter for preprocedural laboratory examination: Secondary | ICD-10-CM | POA: Insufficient documentation

## 2019-10-03 LAB — SARS CORONAVIRUS 2 (TAT 6-24 HRS): SARS Coronavirus 2: NEGATIVE

## 2019-10-04 NOTE — Discharge Instructions (Signed)
General Anesthesia, Adult, Care After This sheet gives you information about how to care for yourself after your procedure. Your health care provider may also give you more specific instructions. If you have problems or questions, contact your health care provider. What can I expect after the procedure? After the procedure, the following side effects are common:  Pain or discomfort at the IV site.  Nausea.  Vomiting.  Sore throat.  Trouble concentrating.  Feeling cold or chills.  Weak or tired.  Sleepiness and fatigue.  Soreness and body aches. These side effects can affect parts of the body that were not involved in surgery. Follow these instructions at home:  For at least 24 hours after the procedure:  Have a responsible adult stay with you. It is important to have someone help care for you until you are awake and alert.  Rest as needed.  Do not: ? Participate in activities in which you could fall or become injured. ? Drive. ? Use heavy machinery. ? Drink alcohol. ? Take sleeping pills or medicines that cause drowsiness. ? Make important decisions or sign legal documents. ? Take care of children on your own. Eating and drinking  Follow any instructions from your health care provider about eating or drinking restrictions.  When you feel hungry, start by eating small amounts of foods that are soft and easy to digest (bland), such as toast. Gradually return to your regular diet.  Drink enough fluid to keep your urine pale yellow.  If you vomit, rehydrate by drinking water, juice, or clear broth. General instructions  If you have sleep apnea, surgery and certain medicines can increase your risk for breathing problems. Follow instructions from your health care provider about wearing your sleep device: ? Anytime you are sleeping, including during daytime naps. ? While taking prescription pain medicines, sleeping medicines, or medicines that make you drowsy.  Return to  your normal activities as told by your health care provider. Ask your health care provider what activities are safe for you.  Take over-the-counter and prescription medicines only as told by your health care provider.  If you smoke, do not smoke without supervision.  Keep all follow-up visits as told by your health care provider. This is important. Contact a health care provider if:  You have nausea or vomiting that does not get better with medicine.  You cannot eat or drink without vomiting.  You have pain that does not get better with medicine.  You are unable to pass urine.  You develop a skin rash.  You have a fever.  You have redness around your IV site that gets worse. Get help right away if:  You have difficulty breathing.  You have chest pain.  You have blood in your urine or stool, or you vomit blood. Summary  After the procedure, it is common to have a sore throat or nausea. It is also common to feel tired.  Have a responsible adult stay with you for the first 24 hours after general anesthesia. It is important to have someone help care for you until you are awake and alert.  When you feel hungry, start by eating small amounts of foods that are soft and easy to digest (bland), such as toast. Gradually return to your regular diet.  Drink enough fluid to keep your urine pale yellow.  Return to your normal activities as told by your health care provider. Ask your health care provider what activities are safe for you. This information is not   intended to replace advice given to you by your health care provider. Make sure you discuss any questions you have with your health care provider. Document Revised: 01/22/2017 Document Reviewed: 09/04/2016 Elsevier Patient Education  2020 Elsevier Inc.  

## 2019-10-05 ENCOUNTER — Ambulatory Visit: Payer: Medicare HMO | Admitting: Anesthesiology

## 2019-10-05 ENCOUNTER — Encounter: Payer: Self-pay | Admitting: Gastroenterology

## 2019-10-05 ENCOUNTER — Other Ambulatory Visit: Payer: Self-pay

## 2019-10-05 ENCOUNTER — Ambulatory Visit
Admission: RE | Admit: 2019-10-05 | Discharge: 2019-10-05 | Disposition: A | Discharge status: Home / Self Care | Payer: Medicare HMO | Attending: Gastroenterology | Admitting: Gastroenterology

## 2019-10-05 ENCOUNTER — Encounter
Admission: RE | Disposition: A | Discharge status: Home / Self Care | Payer: Self-pay | Source: Home / Self Care | Attending: Gastroenterology

## 2019-10-05 DIAGNOSIS — K648 Other hemorrhoids: Secondary | ICD-10-CM | POA: Diagnosis not present

## 2019-10-05 DIAGNOSIS — Z79899 Other long term (current) drug therapy: Secondary | ICD-10-CM | POA: Diagnosis not present

## 2019-10-05 DIAGNOSIS — I10 Essential (primary) hypertension: Secondary | ICD-10-CM | POA: Insufficient documentation

## 2019-10-05 DIAGNOSIS — Z8249 Family history of ischemic heart disease and other diseases of the circulatory system: Secondary | ICD-10-CM | POA: Diagnosis not present

## 2019-10-05 DIAGNOSIS — Z87891 Personal history of nicotine dependence: Secondary | ICD-10-CM | POA: Insufficient documentation

## 2019-10-05 DIAGNOSIS — K5730 Diverticulosis of large intestine without perforation or abscess without bleeding: Secondary | ICD-10-CM | POA: Insufficient documentation

## 2019-10-05 DIAGNOSIS — R195 Other fecal abnormalities: Secondary | ICD-10-CM

## 2019-10-05 DIAGNOSIS — E785 Hyperlipidemia, unspecified: Secondary | ICD-10-CM | POA: Diagnosis not present

## 2019-10-05 DIAGNOSIS — Z888 Allergy status to other drugs, medicaments and biological substances status: Secondary | ICD-10-CM | POA: Diagnosis not present

## 2019-10-05 DIAGNOSIS — Z881 Allergy status to other antibiotic agents status: Secondary | ICD-10-CM | POA: Diagnosis not present

## 2019-10-05 DIAGNOSIS — Z887 Allergy status to serum and vaccine status: Secondary | ICD-10-CM | POA: Insufficient documentation

## 2019-10-05 HISTORY — DX: Presence of dental prosthetic device (complete) (partial): Z97.2

## 2019-10-05 HISTORY — PX: COLONOSCOPY WITH PROPOFOL: SHX5780

## 2019-10-05 SURGERY — COLONOSCOPY WITH PROPOFOL
Anesthesia: General | Site: Rectum

## 2019-10-05 MED ORDER — STERILE WATER FOR IRRIGATION IR SOLN
Status: DC | PRN
  Administered 2019-10-05: 100 mL

## 2019-10-05 MED ORDER — LACTATED RINGERS IV SOLN: INTRAVENOUS | Status: DC | PRN

## 2019-10-05 MED ORDER — SODIUM CHLORIDE 0.9 % IV SOLN: INTRAVENOUS | Status: DC

## 2019-10-05 MED ORDER — PROPOFOL 10 MG/ML IV BOLUS
INTRAVENOUS | Status: DC | PRN
  Administered 2019-10-05 (×2): 40 mg via INTRAVENOUS
  Administered 2019-10-05: 120 mg via INTRAVENOUS
  Administered 2019-10-05: 30 mg via INTRAVENOUS

## 2019-10-05 MED ORDER — LIDOCAINE HCL (CARDIAC) PF 100 MG/5ML IV SOSY
PREFILLED_SYRINGE | INTRAVENOUS | Status: DC | PRN
  Administered 2019-10-05: 30 mg via INTRAVENOUS

## 2019-10-05 SURGICAL SUPPLY — 24 items
CLIP HMST 235XBRD CATH ROT (MISCELLANEOUS) IMPLANT
CLIP RESOLUTION 360 11X235 (MISCELLANEOUS)
ELECT REM PT RETURN 9FT ADLT (ELECTROSURGICAL)
ELECTRODE REM PT RTRN 9FT ADLT (ELECTROSURGICAL) IMPLANT
FCP ESCP3.2XJMB 240X2.8X (MISCELLANEOUS)
FORCEPS BIOP RAD 4 LRG CAP 4 (CUTTING FORCEPS) IMPLANT
FORCEPS BIOP RJ4 240 W/NDL (MISCELLANEOUS)
FORCEPS ESCP3.2XJMB 240X2.8X (MISCELLANEOUS) IMPLANT
GOWN CVR UNV OPN BCK APRN NK (MISCELLANEOUS) ×2 IMPLANT
GOWN ISOL THUMB LOOP REG UNIV (MISCELLANEOUS) ×4
INJECTOR VARIJECT VIN23 (MISCELLANEOUS) IMPLANT
KIT DEFENDO VALVE AND CONN (KITS) IMPLANT
KIT ENDO PROCEDURE OLY (KITS) ×2 IMPLANT
MANIFOLD NEPTUNE II (INSTRUMENTS) ×2 IMPLANT
MARKER SPOT ENDO TATTOO 5ML (MISCELLANEOUS) IMPLANT
PROBE APC STR FIRE (PROBE) IMPLANT
RETRIEVER NET ROTH 2.5X230 LF (MISCELLANEOUS) IMPLANT
SNARE SHORT THROW 13M SML OVAL (MISCELLANEOUS) IMPLANT
SNARE SHORT THROW 30M LRG OVAL (MISCELLANEOUS) IMPLANT
SNARE SNG USE RND 15MM (INSTRUMENTS) IMPLANT
SPOT EX ENDOSCOPIC TATTOO (MISCELLANEOUS)
TRAP ETRAP POLY (MISCELLANEOUS) IMPLANT
VARIJECT INJECTOR VIN23 (MISCELLANEOUS)
WATER STERILE IRR 250ML POUR (IV SOLUTION) ×2 IMPLANT

## 2019-10-05 NOTE — Op Note (Signed)
[facility] Gastroenterology Patient Name: Selena Beasley Procedure Date: 10/05/2019 9:21 AM MRN: [id number] Account #: [id number] Date of Birth: July 31, 1950 Admit Type: Outpatient Age: 69 Room: [facility] OR ROOM 01 Gender: Female Note Status: Finalized Procedure:             Colonoscopy Indications:           Positive Cologuard test Providers:             Lucilla Lame MD, MD Referring MD:          Elveria Rising. Damita Dunnings, MD (Referring MD) Medicines:             Propofol per Anesthesia Complications:         No immediate complications. Procedure:             Pre-Anesthesia Assessment:                        - Prior to the procedure, a History and Physical was                         performed, and patient medications and allergies were                         reviewed. The patient's tolerance of previous                         anesthesia was also reviewed. The risks and benefits                         of the procedure and the sedation options and risks                         were discussed with the patient. All questions were                         answered, and informed consent was obtained. Prior                         Anticoagulants: The patient has taken no previous                         anticoagulant or antiplatelet agents. ASA Grade                         Assessment: II - A patient with mild systemic disease.                         After reviewing the risks and benefits, the patient                         was deemed in satisfactory condition to undergo the                         procedure.                        After obtaining informed consent, the colonoscope was  passed under direct vision. Throughout the procedure,                         the patient's blood pressure, pulse, and oxygen                         saturations were monitored continuously. The was                         introduced through the anus and advanced to the  the                         cecum, identified by appendiceal orifice and ileocecal                         valve. The colonoscopy was performed without                         difficulty. The patient tolerated the procedure well.                         The quality of the bowel preparation was good. Findings:      The perianal and digital rectal examinations were normal.      Multiple small-mouthed diverticula were found in the sigmoid colon.      Non-bleeding internal hemorrhoids were found during retroflexion. The       hemorrhoids were Grade I (internal hemorrhoids that do not prolapse). Impression:            - Diverticulosis in the sigmoid colon.                        - Non-bleeding internal hemorrhoids.                        - No specimens collected. Recommendation:        - Discharge patient to home.                        - Resume previous diet.                        - Continue present medications.                        - Repeat colonoscopy in 10 years for screening unless                         any change in family history or lower GI problems. Procedure Code(s):     --- Professional ---                        [id number], Colonoscopy, flexible; diagnostic, including                         collection of specimen(s) by brushing or washing, when                         performed (separate procedure) Diagnosis Code(s):     --- Professional ---  R19.5, Other fecal abnormalities CPT copyright 2019 American Medical Association. All rights reserved. The codes documented in this report are preliminary and upon coder review may  be revised to meet current compliance requirements. Lucilla Lame MD, MD 10/05/2019 9:43:44 AM This report has been signed electronically. Number of Addenda: 0 Note Initiated On: 10/05/2019 9:21 AM Scope Withdrawal Time: 0 hours 7 minutes 4 seconds  Total Procedure Duration: 0 hours 9 minutes 46 seconds  Estimated Blood Loss:  Estimated blood  loss: none.      [facility]

## 2019-10-05 NOTE — Interval H&P Note (Signed)
History and Physical Interval Note:  10/05/2019 9:18 AM  Lannette Donath T Sladek  has presented today for surgery, with the diagnosis of Positive cologuard R19.5.  The various methods of treatment have been discussed with the patient and family. After consideration of risks, benefits and other options for treatment, the patient has consented to  Procedure(s): COLONOSCOPY WITH PROPOFOL (N/A) as a surgical intervention.  The patient's history has been reviewed, patient examined, no change in status, stable for surgery.  I have reviewed the patient's chart and labs.  Questions were answered to the patient's satisfaction.     Zaakirah Kistner [employer]

## 2019-10-05 NOTE — Transfer of Care (Signed)
Immediate Anesthesia Transfer of Care Note  Patient: Selena Beasley  Procedure(s) Performed: COLONOSCOPY WITH PROPOFOL (N/A Rectum)  Patient Location: PACU  Anesthesia Type: General  Level of Consciousness: awake, alert  and patient cooperative  Airway and Oxygen Therapy: Patient Spontanous Breathing and Patient connected to supplemental oxygen  Post-op Assessment: Post-op Vital signs reviewed, Patient's Cardiovascular Status Stable, Respiratory Function Stable, Patent Airway and No signs of Nausea or vomiting  Post-op Vital Signs: Reviewed and stable  Complications: No complications documented.

## 2019-10-05 NOTE — Anesthesia Procedure Notes (Signed)
Date/Time: 10/05/2019 9:28 AM Performed by: Cameron Ali, CRNA Pre-anesthesia Checklist: Patient identified, Emergency Drugs available, Suction available, Timeout performed and Patient being monitored Patient Re-evaluated:Patient Re-evaluated prior to induction Oxygen Delivery Method: Nasal cannula Placement Confirmation: positive ETCO2

## 2019-10-05 NOTE — Anesthesia Preprocedure Evaluation (Signed)
Anesthesia Evaluation  Patient identified by MRN, date of birth, ID band Patient awake    History of Anesthesia Complications Negative for: history of anesthetic complications  Airway Mallampati: I  TM Distance: >3 FB Neck ROM: Full    Dental  (+) Edentulous Upper   Pulmonary Current Smoker (4 cig per week) and Patient abstained from smoking.,    Pulmonary exam normal        Cardiovascular hypertension, Normal cardiovascular exam     Neuro/Psych negative neurological ROS     GI/Hepatic negative GI ROS, Neg liver ROS,   Endo/Other  negative endocrine ROSneg diabetes  Renal/GU negative Renal ROS     Musculoskeletal   Abdominal   Peds  Hematology negative hematology ROS (+)   Anesthesia Other Findings   Reproductive/Obstetrics                             Anesthesia Physical Anesthesia Plan  ASA: II  Anesthesia Plan: General   Post-op Pain Management:    Induction: Intravenous  PONV Risk Score and Plan: 2 and Propofol infusion, TIVA and Treatment may vary due to age or medical condition  Airway Management Planned: Nasal Cannula and Natural Airway  Additional Equipment: None  Intra-op Plan:   Post-operative Plan:   Informed Consent: I have reviewed the patients History and Physical, chart, labs and discussed the procedure including the risks, benefits and alternatives for the proposed anesthesia with the patient or authorized representative who has indicated his/her understanding and acceptance.       Plan Discussed with: CRNA  Anesthesia Plan Comments:         Anesthesia Quick Evaluation

## 2019-10-05 NOTE — Anesthesia Postprocedure Evaluation (Signed)
Anesthesia Post Note  Patient: Selena Beasley  Procedure(s) Performed: COLONOSCOPY WITH PROPOFOL (N/A Rectum)     Patient location during evaluation: PACU Anesthesia Type: General Level of consciousness: awake and alert Pain management: pain level controlled Vital Signs Assessment: post-procedure vital signs reviewed and stable Respiratory status: spontaneous breathing, nonlabored ventilation, respiratory function stable and patient connected to nasal cannula oxygen Cardiovascular status: blood pressure returned to baseline and stable Postop Assessment: no apparent nausea or vomiting Anesthetic complications: no   No complications documented.  Adele Barthel Chelsy Parrales

## 2019-10-06 ENCOUNTER — Encounter: Payer: Self-pay | Admitting: Gastroenterology

## 2019-11-10 ENCOUNTER — Other Ambulatory Visit: Payer: Self-pay | Admitting: *Deleted

## 2019-11-10 MED ORDER — SIMVASTATIN 40 MG PO TABS
40.0000 mg | ORAL_TABLET | Freq: Every day | ORAL | 2 refills | Status: DC
Start: 2019-11-10 — End: 2020-06-27

## 2019-11-10 MED ORDER — POTASSIUM CHLORIDE ER 10 MEQ PO TBCR: 10.0000 meq | EXTENDED_RELEASE_TABLET | Freq: Every day | ORAL | 2 refills | Status: DC

## 2019-11-10 MED ORDER — TRIAMTERENE-HCTZ 37.5-25 MG PO TABS: 1.0000 | ORAL_TABLET | Freq: Every day | ORAL | 2 refills | Status: DC

## 2020-06-26 ENCOUNTER — Other Ambulatory Visit: Payer: Self-pay

## 2020-06-26 ENCOUNTER — Ambulatory Visit (INDEPENDENT_AMBULATORY_CARE_PROVIDER_SITE_OTHER): Payer: Medicare HMO

## 2020-06-26 DIAGNOSIS — Z0000 Encounter for general adult medical examination without abnormal findings: Secondary | ICD-10-CM | POA: Diagnosis not present

## 2020-06-26 NOTE — Progress Notes (Signed)
Subjective:   RHAYA COALE is a 70 y.o. female who presents for Medicare Annual (Subsequent) preventive examination.  Review of Systems: N/A       I connected with patient today by a video enabled telemedicine application and verified that I am speaking with the correct person using two identifiers.  Location patient: home  Location nurse: work  Persons participating in the virtual visit: patient, nurse     I discussed the limitations, risks, security, and privacy concerns of performing an evaluation and management service by telephone and the availability of in person appointments. The patient expressed understanding and agreed to proceed.       Cardiac Risk Factors include: advanced age (>15men, >1 women);hypertension;Other (see comment), Risk factor comments: hypercholesterolemia     Objective:    Today's Vitals   There is no height or weight on file to calculate BMI.  Advanced Directives 06/26/2020 10/05/2019 06/20/2016  Does Patient Have a Medical Advance Directive? Yes Yes No;Yes  Type of Paramedic of [place];Living will [place];Living will [place];Living will  Does patient want to make changes to medical advance directive? - No - Patient declined -  Copy of [place] in Chart? No - copy requested No - copy requested No - copy requested  Would patient like information on creating a medical advance directive? - - No - Patient declined    Current Medications (verified) Outpatient Encounter Medications as of 06/26/2020  Medication Sig  . cholecalciferol (VITAMIN D) 25 MCG (1000 UNIT) tablet Take 1 tablet (1,000 Units total) by mouth daily.  Marland Kitchen loratadine (CLARITIN) 10 MG tablet Take 10 mg by mouth daily.  . Multiple Vitamin (MULTIVITAMIN) tablet Take 1 tablet by mouth daily.  . potassium chloride (KLOR-CON) 10 MEQ tablet Take 1 tablet (10 mEq total) by mouth daily.  .  simvastatin (ZOCOR) 40 MG tablet Take 1 tablet (40 mg total) by mouth at bedtime.  . triamterene-hydrochlorothiazide (MAXZIDE-25) 37.5-25 MG tablet Take 1 tablet by mouth daily.   No facility-administered encounter medications on file as of 06/26/2020.    Allergies (verified) Neomycin, Polymyxin b, and Zostavax [zoster vaccine live]   History: Past Medical History:  Diagnosis Date  . HTN (hypertension)   . Hyperlipidemia   . Wears dentures    full upper and lower   Past Surgical History:  Procedure Laterality Date  . COLONOSCOPY WITH PROPOFOL N/A 10/05/2019   Procedure: COLONOSCOPY WITH PROPOFOL;  Surgeon: Lucilla Lame, MD;  Location: [facility];  Service: Endoscopy;  Laterality: N/A;  . NSVD     x3 (Last 1995)   Family History  Problem Relation Age of Onset  . Heart attack Father 23       deceased from same  . Heart disease Father   . Alcohol abuse Father   . Hypertension Mother   . Cancer Mother        breast  . Breast cancer Mother   . Diabetes Brother   . Diabetes Maternal Grandmother   . Colon cancer Neg Hx    Social History   Socioeconomic History  . Marital status: Single    Spouse name: Not on file  . Number of children: 1  . Years of education: Not on file  . Highest education level: Not on file  Occupational History  . Occupation: Counsellor    Comment: @ [employer]  Tobacco Use  . Smoking status: Current Every Day  Smoker    Packs/day: 0.25    Years: 48.00    Pack years: 12.00    Types: Cigarettes  . Smokeless tobacco: Never Used  . Tobacco comment: 5 cigs weekly  Vaping Use  . Vaping Use: Never used  Substance and Sexual Activity  . Alcohol use: No  . Drug use: No  . Sexual activity: Not Currently  Other Topics Concern  . 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 Difficulty of Paying Living Expenses: Not hard at all  Food Insecurity: No Food Insecurity  . Worried About Charity fundraiser in the Last Year: Never true  . Ran Out of Food in the Last Year: Never true  Transportation Needs: No Transportation Needs  . Lack of Transportation (Medical): No  . Lack of Transportation (Non-Medical): No  Physical Activity: Inactive  . Days of Exercise per Week: 0 days  . Minutes of Exercise per Session: 0 min  Stress: No Stress Concern Present  . Feeling of Stress : Not at all  Social Connections: Not on file    Tobacco Counseling Ready to quit: Not Answered Counseling given: Not Answered Comment: 5 cigs weekly   Clinical Intake:  Pre-visit preparation completed: Yes  Pain : No/denies pain     Nutritional Risks: None Diabetes: No  How often do you need to have someone help you when you read instructions, pamphlets, or other written materials from your doctor or pharmacy?: 1 - Never What is the last grade level you completed in school?: 12th  Diabetic: No Nutrition Risk Assessment:  Has the patient had any N/V/D within the last 2 months?  No  Does the patient have any non-healing wounds?  No  Has the patient had any unintentional weight loss or weight gain?  No   Diabetes:  Is the patient diabetic?  No  If diabetic, was a CBG obtained today?  N/A Did the patient bring in their glucometer from home?  N/A How often do you monitor your CBG's? N/A.   Financial Strains and Diabetes Management:  Are you having any financial strains with the device, your supplies or your medication? N/A.  Does the patient want to be seen by Chronic Care Management for management of their diabetes?  N/A Would the patient like to be referred to a Nutritionist or for Diabetic Management?  N/A Interpreter Needed?: No  Information entered by :: cJohnson, LPN   Activities of Daily  Living In your present state of health, do you have any difficulty performing the following activities: 06/26/2020 10/05/2019  Hearing? N N  Vision? N N  Difficulty concentrating or making decisions? N N  Walking or climbing stairs? N N  Dressing or bathing? N N  Doing errands, shopping? N -  Preparing Food and eating ? N -  Using the Toilet? N -  In the past six months, have you accidently leaked urine? N -  Do you have problems with loss of bowel control? N -  Managing your Medications? N -  Managing your Finances? N -  Housekeeping or managing your Housekeeping? N -  Some recent data might be hidden    Patient Care Team: Tonia Ghent, MD as PCP -  General (Family Medicine)  Indicate any recent Medical Services you may have received from other than [facility] providers in the past year (date may be approximate).     Assessment:   This is a routine wellness examination for [place].  Hearing/Vision screen  Hearing Screening   125Hz  250Hz  500Hz  1000Hz  2000Hz  3000Hz  4000Hz  6000Hz  8000Hz   Right ear:           Left ear:           Vision Screening Comments: Patient gets annual eye exams   Dietary issues and exercise activities discussed: Current Exercise Habits: The patient does not participate in regular exercise at present, Exercise limited by: None identified  Goals Addressed            This Visit's Progress   . Patient Stated       06/26/2020, I will maintain and continue medications as prescribed.       Depression Screen PHQ 2/9 Scores 06/26/2020 06/26/2019 06/24/2018 06/18/2017  PHQ - 2 Score 0 0 0 0  PHQ- 9 Score 0 - - -    Fall Risk Fall Risk  06/26/2020 06/26/2019 06/24/2018  Falls in the past year? 0 0 0  Number falls in past yr: 0 - 0  Injury with Fall? 0 - 0  Risk for fall due to : Medication side effect - -  Follow up Falls evaluation completed;Falls prevention discussed - -    FALL RISK PREVENTION PERTAINING TO THE HOME:  Any stairs in or around the home?  Yes  If so, are there any without handrails? No  Home free of loose throw rugs in walkways, pet beds, electrical cords, etc? Yes  Adequate lighting in your home to reduce risk of falls? Yes   ASSISTIVE DEVICES UTILIZED TO PREVENT FALLS:  Life alert? No  Use of a cane, walker or w/c? No  Grab bars in the bathroom? No  Shower chair or bench in shower? No  Elevated toilet seat or a handicapped toilet? No   TIMED UP AND GO:  Was the test performed? N/A virtual visit .   Cognitive Function: MMSE - Mini Mental State Exam 06/26/2020  Orientation to time 5  Orientation to Place 5  Registration 3  Attention/ Calculation 5  Recall 3  Language- repeat 1       Mini Cog  Mini-Cog screen was completed. Maximum score is 22. A value of 0 denotes this part of the MMSE was not completed or the patient failed this part of the Mini-Cog screening.  Immunizations Immunization History  Administered Date(s) Administered  . Fluad Quad(high Dose 65+) 10/11/2018  . Influenza Whole 12/04/2003  . Influenza,inj,Quad PF,6+ Mos 11/12/2017  . Influenza-Unspecified 11/22/2014  . Pneumococcal Conjugate-13 10/11/2018  . Td 08/14/2002  . Tdap 06/03/2012    TDAP status: Up to date  Flu Vaccine status: due Fall 2022  Pneumococcal vaccine status: Due, Education has been provided regarding the importance of this vaccine. Advised may receive this vaccine at local pharmacy or Health Dept. Aware to provide a copy of the vaccination record if obtained from local pharmacy or Health Dept. Verbalized acceptance and understanding.  Covid-19 vaccine status: Declined, Education has been provided regarding the importance of this vaccine but patient still declined. Advised may receive this vaccine at local pharmacy or Health Dept.or vaccine clinic. Aware to provide a copy of the vaccination record if obtained from local pharmacy or Health Dept. Verbalized acceptance and understanding.  Qualifies for Shingles Vaccine?  No  allergy Zostavax completed No  allergy Shingrix Completed: allergy  Screening Tests Health Maintenance  Topic Date Due  . DEXA SCAN  Never done  . PNA vac Low Risk Adult (2 of 2 - PPSV23) 10/11/2019  . COVID-19 Vaccine (1) 07/12/2020 (Originally 12/23/1955)  . MAMMOGRAM  07/12/2020  . INFLUENZA VACCINE  09/02/2020  . TETANUS/TDAP  06/04/2022  . COLONOSCOPY (Pts 45-23yrs Insurance coverage will need to be confirmed)  10/04/2029  . Hepatitis C Screening  Completed  . HPV VACCINES  Aged Out    Health Maintenance  Health Maintenance Due  Topic Date Due  . DEXA SCAN  Never done  . PNA vac Low Risk Adult (2 of 2 - PPSV23) 10/11/2019    Colorectal cancer screening: Type of screening: Colonoscopy. Completed 10/05/2019. Repeat every 10 years  Mammogram status: Completed 07/13/2019. Repeat every year  Bone Density status: due, will discuss with provider at physical   Lung Cancer Screening: (Low Dose CT Chest recommended if Age 34-80 years, 30 pack-year currently smoking OR have quit w/in 15 years) does not qualify.    Additional Screening:  Hepatitis C Screening: does qualify; Completed 06/10/2015  Vision Screening: Recommended annual ophthalmology exams for early detection of glaucoma and other disorders of the eye. Is the patient up to date with their annual eye exam?  Yes  Who is the provider or what is the name of the office in which the patient attends annual eye exams? [facility]  If pt is not established with a provider, would they like to be referred to a provider to establish care? No   Dental Screening: Recommended annual dental exams for proper oral hygiene  Community Resource Referral / Chronic Care Management: CRR required this visit?  No   CCM required this visit?  No      Plan:     I have personally reviewed and noted the following in the patient's chart:   . Medical and social history . Use of alcohol, tobacco or illicit drugs  . Current  medications and supplements including opioid prescriptions.  . Functional ability and status . Nutritional status . Physical activity . Advanced directives . List of other physicians . Hospitalizations, surgeries, and ER visits in previous 12 months . Vitals . Screenings to include cognitive, depression, and falls . Referrals and appointments  In addition, I have reviewed and discussed with patient certain preventive protocols, quality metrics, and best practice recommendations. A written personalized care plan for preventive services as well as general preventive health recommendations were provided to patient.   Due to this being a virtual visit, the after visit summary with patients personalized plan was offered to patient via office or my-chart. Patient preferred to pick up at office at next visit or via mychart.   Andrez Grime, LPN   03/06/5425

## 2020-06-26 NOTE — Patient Instructions (Signed)
[name] , Thank you for taking time to come for your Medicare Wellness Visit. I appreciate your ongoing commitment to your health goals. Please review the following plan we discussed and let me know if I can assist you in the future.   Screening recommendations/referrals: Colonoscopy: Up to date, completed 10/05/2019, due 10/2029 Mammogram: Up to date, completed 07/13/2019, due 07/2020 Bone Density: due, will discuss with provider at physical  Recommended yearly ophthalmology/optometry visit for glaucoma screening and checkup Recommended yearly dental visit for hygiene and checkup  Vaccinations: Influenza vaccine: due Fall 2022  Pneumococcal vaccine: due, will get at upcoming physical  Tdap vaccine: Up to date, completed 06/03/2012, due 06/2022 Shingles vaccine: allergy   Covid-19:declined   Advanced directives: Please bring a copy of your POA (Power of [place]) and/or Living Will to your next appointment.   Conditions/risks identified: hypertension, hypercholesterolemia   Next appointment: Follow up in one year for your annual wellness visit    Preventive Care 68 Years and Older, Female Preventive care refers to lifestyle choices and visits with your health care provider that can promote health and wellness. What does preventive care include?  A yearly physical exam. This is also called an annual well check.  Dental exams once or twice a year.  Routine eye exams. Ask your health care provider how often you should have your eyes checked.  Personal lifestyle choices, including:  Daily care of your teeth and gums.  Regular physical activity.  Eating a healthy diet.  Avoiding tobacco and drug use.  Limiting alcohol use.  Practicing safe sex.  Taking low-dose aspirin every day.  Taking vitamin and mineral supplements as recommended by your health care provider. What happens during an annual well check? The services and screenings done by your health care provider during  your annual well check will depend on your age, overall health, lifestyle risk factors, and family history of disease. Counseling  Your health care provider may ask you questions about your:  Alcohol use.  Tobacco use.  Drug use.  Emotional well-being.  Home and relationship well-being.  Sexual activity.  Eating habits.  History of falls.  Memory and ability to understand (cognition).  Work and work Statistician.  Reproductive health. Screening  You may have the following tests or measurements:  Height, weight, and BMI.  Blood pressure.  Lipid and cholesterol levels. These may be checked every 5 years, or more frequently if you are over 77 years old.  Skin check.  Lung cancer screening. You may have this screening every year starting at age 38 if you have a 30-pack-year history of smoking and currently smoke or have quit within the past 15 years.  Fecal occult blood test (FOBT) of the stool. You may have this test every year starting at age 44.  Flexible sigmoidoscopy or colonoscopy. You may have a sigmoidoscopy every 5 years or a colonoscopy every 10 years starting at age 5.  Hepatitis C blood test.  Hepatitis B blood test.  Sexually transmitted disease (STD) testing.  Diabetes screening. This is done by checking your blood sugar (glucose) after you have not eaten for a while (fasting). You may have this done every 1-3 years.  Bone density scan. This is done to screen for osteoporosis. You may have this done starting at age 13.  Mammogram. This may be done every 1-2 years. Talk to your health care provider about how often you should have regular mammograms. Talk with your health care provider about your test results,  treatment options, and if necessary, the need for more tests. Vaccines  Your health care provider may recommend certain vaccines, such as:  Influenza vaccine. This is recommended every year.  Tetanus, diphtheria, and acellular pertussis (Tdap,  Td) vaccine. You may need a Td booster every 10 years.  Zoster vaccine. You may need this after age 1.  Pneumococcal 13-valent conjugate (PCV13) vaccine. One dose is recommended after age 61.  Pneumococcal polysaccharide (PPSV23) vaccine. One dose is recommended after age 65. Talk to your health care provider about which screenings and vaccines you need and how often you need them. This information is not intended to replace advice given to you by your health care provider. Make sure you discuss any questions you have with your health care provider. Document Released: 02/15/2015 Document Revised: 10/09/2015 Document Reviewed: 11/20/2014 Elsevier Interactive Patient Education  2017 [place] Prevention in the Home Falls can cause injuries. They can happen to people of all ages. There are many things you can do to make your home safe and to help prevent falls. What can I do on the outside of my home?  Regularly fix the edges of walkways and driveways and fix any cracks.  Remove anything that might make you trip as you walk through a door, such as a raised step or threshold.  Trim any bushes or trees on the path to your home.  Use bright outdoor lighting.  Clear any walking paths of anything that might make someone trip, such as rocks or tools.  Regularly check to see if handrails are loose or broken. Make sure that both sides of any steps have handrails.  Any raised decks and porches should have guardrails on the edges.  Have any leaves, snow, or ice cleared regularly.  Use sand or salt on walking paths during winter.  Clean up any spills in your garage right away. This includes oil or grease spills. What can I do in the bathroom?  Use night lights.  Install grab bars by the toilet and in the tub and shower. Do not use towel bars as grab bars.  Use non-skid mats or decals in the tub or shower.  If you need to sit down in the shower, use a plastic, non-slip  stool.  Keep the floor dry. Clean up any water that spills on the floor as soon as it happens.  Remove soap buildup in the tub or shower regularly.  Attach bath mats securely with double-sided non-slip rug tape.  Do not have throw rugs and other things on the floor that can make you trip. What can I do in the bedroom?  Use night lights.  Make sure that you have a light by your bed that is easy to reach.  Do not use any sheets or blankets that are too big for your bed. They should not hang down onto the floor.  Have a firm chair that has side arms. You can use this for support while you get dressed.  Do not have throw rugs and other things on the floor that can make you trip. What can I do in the kitchen?  Clean up any spills right away.  Avoid walking on wet floors.  Keep items that you use a lot in easy-to-reach places.  If you need to reach something above you, use a strong step stool that has a grab bar.  Keep electrical cords out of the way.  Do not use floor polish or wax that makes floors  slippery. If you must use wax, use non-skid floor wax.  Do not have throw rugs and other things on the floor that can make you trip. What can I do with my stairs?  Do not leave any items on the stairs.  Make sure that there are handrails on both sides of the stairs and use them. Fix handrails that are broken or loose. Make sure that handrails are as long as the stairways.  Check any carpeting to make sure that it is firmly attached to the stairs. Fix any carpet that is loose or worn.  Avoid having throw rugs at the top or bottom of the stairs. If you do have throw rugs, attach them to the floor with carpet tape.  Make sure that you have a light switch at the top of the stairs and the bottom of the stairs. If you do not have them, ask someone to add them for you. What else can I do to help prevent falls?  Wear shoes that:  Do not have high heels.  Have rubber bottoms.  Are  comfortable and fit you well.  Are closed at the toe. Do not wear sandals.  If you use a stepladder:  Make sure that it is fully opened. Do not climb a closed stepladder.  Make sure that both sides of the stepladder are locked into place.  Ask someone to hold it for you, if possible.  Clearly mark and make sure that you can see:  Any grab bars or handrails.  First and last steps.  Where the edge of each step is.  Use tools that help you move around (mobility aids) if they are needed. These include:  Canes.  Walkers.  Scooters.  Crutches.  Turn on the lights when you go into a dark area. Replace any light bulbs as soon as they burn out.  Set up your furniture so you have a clear path. Avoid moving your furniture around.  If any of your floors are uneven, fix them.  If there are any pets around you, be aware of where they are.  Review your medicines with your doctor. Some medicines can make you feel dizzy. This can increase your chance of falling. Ask your doctor what other things that you can do to help prevent falls. This information is not intended to replace advice given to you by your health care provider. Make sure you discuss any questions you have with your health care provider. Document Released: 11/15/2008 Document Revised: 06/27/2015 Document Reviewed: 02/23/2014 Elsevier Interactive Patient Education  2017 [employer].

## 2020-06-26 NOTE — Progress Notes (Signed)
PCP notes:  Health Maintenance: Pneumococcal 23- due Dexa- due   Abnormal Screenings: none   Patient concerns: none   Nurse concerns: none   Next PCP appt.: 06/27/2020 @ 9 am

## 2020-06-27 ENCOUNTER — Other Ambulatory Visit: Payer: Self-pay

## 2020-06-27 ENCOUNTER — Encounter: Payer: Self-pay | Admitting: Family Medicine

## 2020-06-27 ENCOUNTER — Ambulatory Visit (INDEPENDENT_AMBULATORY_CARE_PROVIDER_SITE_OTHER): Payer: Medicare HMO | Admitting: Family Medicine

## 2020-06-27 VITALS — BP 120/78 | HR 72 | Temp 96.9°F | Ht 63.0 in | Wt 152.0 lb

## 2020-06-27 DIAGNOSIS — R0989 Other specified symptoms and signs involving the circulatory and respiratory systems: Secondary | ICD-10-CM

## 2020-06-27 DIAGNOSIS — Z0001 Encounter for general adult medical examination with abnormal findings: Secondary | ICD-10-CM

## 2020-06-27 DIAGNOSIS — Z0000 Encounter for general adult medical examination without abnormal findings: Secondary | ICD-10-CM

## 2020-06-27 DIAGNOSIS — E7800 Pure hypercholesterolemia, unspecified: Secondary | ICD-10-CM

## 2020-06-27 DIAGNOSIS — Z7189 Other specified counseling: Secondary | ICD-10-CM

## 2020-06-27 DIAGNOSIS — I10 Essential (primary) hypertension: Secondary | ICD-10-CM | POA: Diagnosis not present

## 2020-06-27 DIAGNOSIS — Z780 Asymptomatic menopausal state: Secondary | ICD-10-CM

## 2020-06-27 DIAGNOSIS — E559 Vitamin D deficiency, unspecified: Secondary | ICD-10-CM | POA: Diagnosis not present

## 2020-06-27 DIAGNOSIS — Z23 Encounter for immunization: Secondary | ICD-10-CM

## 2020-06-27 LAB — COMPREHENSIVE METABOLIC PANEL
ALT: 22 U/L (ref 0–35)
AST: 20 U/L (ref 0–37)
Albumin: 4.7 g/dL (ref 3.5–5.2)
Alkaline Phosphatase: 57 U/L (ref 39–117)
BUN: 18 mg/dL (ref 6–23)
CO2: 31 mEq/L (ref 19–32)
Calcium: 10.2 mg/dL (ref 8.4–10.5)
Chloride: 101 mEq/L (ref 96–112)
Creatinine, Ser: 0.59 mg/dL (ref 0.40–1.20)
GFR: 93.2 mL/min (ref >60.00)
Glucose, Bld: 119 mg/dL — ABNORMAL HIGH (ref 70–99)
Potassium: 3.9 mEq/L (ref 3.5–5.1)
Sodium: 140 mEq/L (ref 135–145)
Total Bilirubin: 0.4 mg/dL (ref 0.2–1.2)
Total Protein: 7.4 g/dL (ref 6.0–8.3)

## 2020-06-27 LAB — LIPID PANEL
Cholesterol: 191 mg/dL (ref 0–200)
HDL: 36.5 mg/dL — ABNORMAL LOW (ref >39.00)
LDL Cholesterol: 121 mg/dL — ABNORMAL HIGH (ref 0–99)
NonHDL: 154.18
Total CHOL/HDL Ratio: 5
Triglycerides: 167 mg/dL — ABNORMAL HIGH (ref 0.0–149.0)
VLDL: 33.4 mg/dL (ref 0.0–40.0)

## 2020-06-27 LAB — VITAMIN D 25 HYDROXY (VIT D DEFICIENCY, FRACTURES): VITD: 45.53 ng/mL (ref 30.00–100.00)

## 2020-06-27 MED ORDER — SIMVASTATIN 40 MG PO TABS
40.0000 mg | ORAL_TABLET | Freq: Every day | ORAL | 3 refills | Status: DC
Start: 2020-06-27 — End: 2021-07-01

## 2020-06-27 MED ORDER — TRIAMTERENE-HCTZ 37.5-25 MG PO TABS
1.0000 | ORAL_TABLET | Freq: Every day | ORAL | 3 refills | Status: DC
Start: 2020-06-27 — End: 2021-07-01

## 2020-06-27 MED ORDER — POTASSIUM CHLORIDE ER 10 MEQ PO TBCR
10.0000 meq | EXTENDED_RELEASE_TABLET | Freq: Every day | ORAL | 3 refills | Status: DC
Start: 2020-06-27 — End: 2021-07-01

## 2020-06-27 NOTE — Patient Instructions (Addendum)
Go to the lab on the way out.   If you have mychart we'll likely use that to update you.    Take care.  Glad to see you. We'll call about the carotid ultrasound and bone density test.  Let me know if you want to go for the lung cancer screening program.

## 2020-06-27 NOTE — Progress Notes (Signed)
This visit occurred during the SARS-CoV-2 public health emergency.  Safety protocols were in place, including screening questions prior to the visit, additional usage of staff PPE, and extensive cleaning of exam room while observing appropriate contact time as indicated for disinfecting solutions.  I have personally reviewed the Medicare Annual Wellness questionnaire and have noted 1. The patient's medical and social history 2. Their use of alcohol, tobacco or illicit drugs 3. Their current medications and supplements 4. The patient's functional ability including ADL's, fall risks, home safety risks and hearing or visual             impairment. 5. Diet and physical activities 6. Evidence for depression or mood disorders  The patients weight, height, BMI have been recorded in the chart and visual acuity is per eye clinic.  I have made referrals, counseling and provided education to the patient based review of the above and I have provided the pt with a written personalized care plan for preventive services.  Provider list updated- see scanned forms.  Routine anticipatory guidance given to patient.  See health maintenance. The possibility exists that previously documented standard health maintenance information may have been brought forward from a previous encounter into this note.  If needed, that same information has been updated to reflect the current situation based on today's encounter.    Tetanus 2014  Flu shot prev done Shingles shot d/w pt. see following phone note. PNA shot 2022 covid vaccine d/w pt.   colonoscopy 2021 Mammogram 2021 DXA ordered 2022 Pap not due.   Living will d/w pt. Would have Norvel Richards designated if she were incapacitated.  Diet and exercise encouraged. Encouraged smoking cessation again.  She is working on cessation.  She is smoking much less than prev.   HIV and HCV screening done 2017.  Lung cancer screening d/w pt.  She can consider.     Hypertension:    Using medication without problems or lightheadedness: yes Chest pain with exertion:no Edema:no Short of breath:no  Elevated Cholesterol: Using medications without problems: yes Muscle aches: no Diet compliance: encouraged.   Exercise:encouraged  PMH and SH reviewed  Meds, vitals, and allergies reviewed.   ROS: Per HPI.  Unless specifically indicated otherwise in HPI, the patient denies:  General: fever. Eyes: acute vision changes ENT: sore throat Cardiovascular: chest pain Respiratory: SOB GI: vomiting GU: dysuria Musculoskeletal: acute back pain Derm: acute rash Neuro: acute motor dysfunction Psych: worsening mood Endocrine: polydipsia Heme: bleeding Allergy: hayfever  GEN: nad, alert and oriented HEENT: ncat NECK: supple w/o LA, R carotid bruit noted, not on the L.   CV: rrr. PULM: ctab, no inc wob ABD: soft, +bs EXT: no edema SKIN: no acute rash  43 minutes were devoted to patient care in this encounter (this includes time spent reviewing the patient's file/history, interviewing and examining the patient, counseling/reviewing plan with patient).

## 2020-07-01 ENCOUNTER — Encounter: Payer: Self-pay | Admitting: Family Medicine

## 2020-07-03 ENCOUNTER — Other Ambulatory Visit: Payer: Self-pay | Admitting: Family Medicine

## 2020-07-03 ENCOUNTER — Telehealth: Payer: Self-pay | Admitting: Family Medicine

## 2020-07-03 DIAGNOSIS — L989 Disorder of the skin and subcutaneous tissue, unspecified: Secondary | ICD-10-CM

## 2020-07-03 DIAGNOSIS — R0989 Other specified symptoms and signs involving the circulatory and respiratory systems: Secondary | ICD-10-CM | POA: Insufficient documentation

## 2020-07-03 NOTE — Assessment & Plan Note (Signed)
Continue triamterene hydrochlorothiazide and potassium.  Continue work on diet and exercise.

## 2020-07-03 NOTE — Assessment & Plan Note (Signed)
Living will d/w pt. Would have Selena Beasley designated if she were incapacitated.

## 2020-07-03 NOTE — Assessment & Plan Note (Signed)
Tetanus 2014  Flu shot prev done Shingles shot d/w pt. see following phone note. PNA shot 2022 covid vaccine d/w pt.   colonoscopy 2021 Mammogram 2021 DXA ordered 2022 Pap not due.   Living will d/w pt. Would have Norvel Richards designated if she were incapacitated.  Diet and exercise encouraged. Encouraged smoking cessation again.  She is working on cessation.  She is smoking much less than prev.   HIV and HCV screening done 2017.

## 2020-07-03 NOTE — Assessment & Plan Note (Addendum)
Continue simvastatin.  Continue work on diet and exercise.  See notes on labs.

## 2020-07-03 NOTE — Telephone Encounter (Signed)
Please update patient.  I did some checking on the shingles vaccine.  She should avoid the old shingles shot given her history of a neomycin allergy.  She should be able to get Shingrix.  That is a separate vaccine and does not have the same neomycin caution.  She can check with the pharmacy as it may be cheaper there.  Thanks.

## 2020-07-03 NOTE — Assessment & Plan Note (Signed)
Incidentally noted on right, no left bruit.  Order placed for carotid ultrasound.  Rationale discussed with patient.

## 2020-07-04 NOTE — Telephone Encounter (Signed)
Patient aware of message below and states she will get the vaccine at the pharmacy when she can.

## 2020-07-05 ENCOUNTER — Telehealth: Payer: Self-pay

## 2020-07-05 NOTE — Telephone Encounter (Signed)
-----   Message from Tonia Ghent, MD sent at 07/03/2020  3:06 PM EDT ----- Is there any way to tell who called her about her previous Cologuard results?  The patient reported getting an unsettling call.  The whole point was that she had a positive result and needed follow-up testing but it was apparently conveyed in a way that alarmed at the patient.  I checked back in the EMR but I cannot see who made the call.  Thanks.  Brigitte Pulse

## 2020-07-05 NOTE — Telephone Encounter (Signed)
Noted. Thanks.

## 2020-07-05 NOTE — Telephone Encounter (Signed)
Contacted pt who reports she was contacted last year by someone in this office, she knows because [place] came up on her phone, about her cologuard results. She remembers distinctly them stating, "I'm sure you've already been called and told that you have colon cancer." She remembers this because she said this actually happened to one of her friends that was not from this office but was told the exact same thing and she remembers how upset her friend was. The pt is not upset currently and does not want "anyone to get in trouble" but wanted PCP to know what happened. At the time she said it was very unsettling and disturbing and that she should have called the office right back but she said with COVID going on it was just so hard to do anything, she just decided not to call.  Advised pt this information is very important to improve and educate and is very welcome and we are thankful that she has shared it with [place]. Pt appreciative of the call. Advised if anything was ever needed in the future or anything happened in the future that was disturbing to contact the office immediately. Pt verbalized understanding.

## 2020-07-15 DIAGNOSIS — M810 Age-related osteoporosis without current pathological fracture: Secondary | ICD-10-CM | POA: Diagnosis not present

## 2020-07-15 DIAGNOSIS — M85852 Other specified disorders of bone density and structure, left thigh: Secondary | ICD-10-CM | POA: Diagnosis not present

## 2020-07-15 DIAGNOSIS — N951 Menopausal and female climacteric states: Secondary | ICD-10-CM | POA: Diagnosis not present

## 2020-07-15 DIAGNOSIS — Z1231 Encounter for screening mammogram for malignant neoplasm of breast: Secondary | ICD-10-CM | POA: Diagnosis not present

## 2020-07-15 LAB — HM MAMMOGRAPHY

## 2020-07-15 LAB — HM DEXA SCAN

## 2020-07-21 ENCOUNTER — Encounter: Payer: Self-pay | Admitting: Family Medicine

## 2020-07-21 DIAGNOSIS — M810 Age-related osteoporosis without current pathological fracture: Secondary | ICD-10-CM | POA: Insufficient documentation

## 2020-09-27 DIAGNOSIS — Z789 Other specified health status: Secondary | ICD-10-CM | POA: Diagnosis not present

## 2020-09-27 DIAGNOSIS — C44319 Basal cell carcinoma of skin of other parts of face: Secondary | ICD-10-CM | POA: Diagnosis not present

## 2020-09-27 DIAGNOSIS — L918 Other hypertrophic disorders of the skin: Secondary | ICD-10-CM | POA: Diagnosis not present

## 2020-09-27 DIAGNOSIS — D225 Melanocytic nevi of trunk: Secondary | ICD-10-CM | POA: Diagnosis not present

## 2020-09-27 DIAGNOSIS — L720 Epidermal cyst: Secondary | ICD-10-CM | POA: Diagnosis not present

## 2020-09-27 DIAGNOSIS — R208 Other disturbances of skin sensation: Secondary | ICD-10-CM | POA: Diagnosis not present

## 2020-09-27 DIAGNOSIS — D2262 Melanocytic nevi of left upper limb, including shoulder: Secondary | ICD-10-CM | POA: Diagnosis not present

## 2020-09-27 DIAGNOSIS — D2261 Melanocytic nevi of right upper limb, including shoulder: Secondary | ICD-10-CM | POA: Diagnosis not present

## 2020-09-27 DIAGNOSIS — D2272 Melanocytic nevi of left lower limb, including hip: Secondary | ICD-10-CM | POA: Diagnosis not present

## 2020-09-27 DIAGNOSIS — D485 Neoplasm of uncertain behavior of skin: Secondary | ICD-10-CM | POA: Diagnosis not present

## 2020-09-27 DIAGNOSIS — D2271 Melanocytic nevi of right lower limb, including hip: Secondary | ICD-10-CM | POA: Diagnosis not present

## 2020-10-15 DIAGNOSIS — H0288B Meibomian gland dysfunction left eye, upper and lower eyelids: Secondary | ICD-10-CM | POA: Diagnosis not present

## 2020-10-15 DIAGNOSIS — Z0100 Encounter for examination of eyes and vision without abnormal findings: Secondary | ICD-10-CM | POA: Diagnosis not present

## 2020-10-15 DIAGNOSIS — H04123 Dry eye syndrome of bilateral lacrimal glands: Secondary | ICD-10-CM | POA: Diagnosis not present

## 2020-10-15 DIAGNOSIS — H0288A Meibomian gland dysfunction right eye, upper and lower eyelids: Secondary | ICD-10-CM | POA: Diagnosis not present

## 2020-10-15 DIAGNOSIS — H2513 Age-related nuclear cataract, bilateral: Secondary | ICD-10-CM | POA: Diagnosis not present

## 2020-10-24 DIAGNOSIS — C44319 Basal cell carcinoma of skin of other parts of face: Secondary | ICD-10-CM | POA: Diagnosis not present

## 2020-10-24 DIAGNOSIS — L988 Other specified disorders of the skin and subcutaneous tissue: Secondary | ICD-10-CM | POA: Diagnosis not present

## 2020-10-24 DIAGNOSIS — L578 Other skin changes due to chronic exposure to nonionizing radiation: Secondary | ICD-10-CM | POA: Diagnosis not present

## 2020-10-24 DIAGNOSIS — L814 Other melanin hyperpigmentation: Secondary | ICD-10-CM | POA: Diagnosis not present

## 2021-04-02 DIAGNOSIS — D2262 Melanocytic nevi of left upper limb, including shoulder: Secondary | ICD-10-CM | POA: Diagnosis not present

## 2021-04-02 DIAGNOSIS — Z85828 Personal history of other malignant neoplasm of skin: Secondary | ICD-10-CM | POA: Diagnosis not present

## 2021-04-02 DIAGNOSIS — D2271 Melanocytic nevi of right lower limb, including hip: Secondary | ICD-10-CM | POA: Diagnosis not present

## 2021-04-02 DIAGNOSIS — L439 Lichen planus, unspecified: Secondary | ICD-10-CM | POA: Diagnosis not present

## 2021-04-02 DIAGNOSIS — L820 Inflamed seborrheic keratosis: Secondary | ICD-10-CM | POA: Diagnosis not present

## 2021-04-02 DIAGNOSIS — D2261 Melanocytic nevi of right upper limb, including shoulder: Secondary | ICD-10-CM | POA: Diagnosis not present

## 2021-04-02 DIAGNOSIS — D485 Neoplasm of uncertain behavior of skin: Secondary | ICD-10-CM | POA: Diagnosis not present

## 2021-05-12 ENCOUNTER — Encounter: Payer: Self-pay | Admitting: Family Medicine

## 2021-05-12 NOTE — Progress Notes (Signed)
Unable to contact patient to schedule echocardiogram. Letter sent, order cancelled 

## 2021-06-23 ENCOUNTER — Telehealth: Payer: Self-pay | Admitting: Family Medicine

## 2021-06-23 DIAGNOSIS — E559 Vitamin D deficiency, unspecified: Secondary | ICD-10-CM

## 2021-06-23 DIAGNOSIS — I10 Essential (primary) hypertension: Secondary | ICD-10-CM

## 2021-06-23 NOTE — Telephone Encounter (Signed)
I put in order for carotid u/s last year.  The order is still in the EMR.  Did patient get that done?  If not is she willing to go?  Please let me know.  Thanks.

## 2021-06-23 NOTE — Telephone Encounter (Signed)
Spoke with patient. She did not have US done as some things had come up and then she did research on this and was not sure if she wanted to have done or not. Patient states she would like to discuss this further with Dr. Damita Dunnings before she decides. She already has an appt scheduled for 07/01/21 at 10:00 am and will discuss then.

## 2021-06-24 NOTE — Telephone Encounter (Signed)
Noted. Thanks.

## 2021-06-27 ENCOUNTER — Ambulatory Visit (INDEPENDENT_AMBULATORY_CARE_PROVIDER_SITE_OTHER): Payer: Medicare HMO

## 2021-06-27 VITALS — Ht 63.0 in | Wt 157.5 lb

## 2021-06-27 DIAGNOSIS — Z0000 Encounter for general adult medical examination without abnormal findings: Secondary | ICD-10-CM | POA: Diagnosis not present

## 2021-06-27 NOTE — Progress Notes (Signed)
I connected with  Selena Beasley today via telehealth video enabled device and verified that I am speaking with the correct person using two identifiers.   Location: Patient: home Provider: work  Persons participating in virtual visit: Selena Beasley, Selena Durand LPN  I discussed the limitations, risks, security and privacy concerns of performing an evaluation and management service by video and the availability of in person appointments. The patient expressed understanding and agreed to proceed.   Some vital signs may be absent or patient reported.      Subjective:   Selena Beasley is a 71 y.o. female who presents for Medicare Annual (Subsequent) preventive examination.  Review of Systems     Cardiac Risk Factors include: advanced age (>63mn, >>10women);hypertension     Objective:    Today's Vitals   06/27/21 1400  Weight: 157 lb 8 oz (71.4 kg)  Height: '5\' 3"'$  (1.6 m)   Body mass index is 27.9 kg/m.     06/27/2021    2:06 PM 06/26/2020    2:11 PM 10/05/2019    8:53 AM 06/20/2016    7:45 PM  Advanced Directives  Does Patient Have a Medical Advance Directive? Yes Yes Yes No;Yes  Type of AParamed[place]of AP[place] ViewLiving will [place]Living will [place]Living will [place] will  Does patient want to make changes to medical advance directive?   No - Patient declined   Copy of [place] Chart? No - copy requested No - copy requested No - copy requested No - copy requested  Would patient like information on creating a medical advance directive?    No - Patient declined    Current Medications (verified) Outpatient Encounter Medications as of 06/27/2021  Medication Sig   cholecalciferol (VITAMIN D) 25 MCG (1000 UNIT) tablet Take 1 tablet (1,000 Units total) by mouth daily.   loratadine (CLARITIN) 10 MG tablet Take 10 mg by mouth daily.   Multiple  Vitamin (MULTIVITAMIN) tablet Take 1 tablet by mouth daily.   potassium chloride (KLOR-CON) 10 MEQ tablet Take 1 tablet (10 mEq total) by mouth daily.   simvastatin (ZOCOR) 40 MG tablet Take 1 tablet (40 mg total) by mouth at bedtime.   triamterene-hydrochlorothiazide (MAXZIDE-25) 37.5-25 MG tablet Take 1 tablet by mouth daily.   No facility-administered encounter medications on file as of 06/27/2021.    Allergies (verified) Neomycin, Polymyxin b, and Zostavax [zoster vaccine live]   History: Past Medical History:  Diagnosis Date   HTN (hypertension)    Hyperlipidemia    Wears dentures    full upper and lower   Past Surgical History:  Procedure Laterality Date   COLONOSCOPY WITH PROPOFOL N/A 10/05/2019   Procedure: COLONOSCOPY WITH PROPOFOL;  Surgeon: WLucilla Lame MD;  [facility]  Service: Endoscopy;  Laterality: N/A;   MOHS SURGERY     10/2020, right forhead   NSVD     x3 (Last 1995)   Family History  Problem Relation Age of Onset   Heart attack Father [phone]      deceased from same   Heart disease Father    Alcohol abuse Father    Hypertension Mother    Cancer Mother        breast   Breast cancer Mother    Diabetes Brother    Diabetes Maternal Grandmother    Colon cancer Neg Hx    Social History   Socioeconomic History  Marital status: Single    Spouse name: Not on file   Number of children: 1   Years of education: Not on file   Highest education level: Not on file  Occupational History   Occupation: Counsellor    Comment: @ [employer]  Tobacco Use   Smoking status: Former    Packs/day: 0.25    Years: 48.00    Pack years: 12.00    Types: Cigarettes   Smokeless tobacco: Never   Tobacco comments:    5 cigs weekly  Vaping Use   Vaping Use: Never used  Substance and Sexual Activity   Alcohol use: No   Drug use: No   Sexual activity: Not Currently  Other Topics Concern   Not on file  Social History Narrative   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   Social Determinants of Health   Financial Resource Strain: Low Risk    Difficulty of Paying Living Expenses: Not hard at all  Food Insecurity: No Food Insecurity   Worried About Charity fundraiser in the Last Year: Never true   [place] in the Last Year: Never true  Transportation Needs: No Transportation Needs   Lack of Transportation (Medical): No   Lack of Transportation (Non-Medical): No  Physical Activity: Inactive   Days of Exercise per Week: 0 days   Minutes of Exercise per Session: 0 min  Stress: No Stress Concern Present   Feeling of Stress : Not at all  Social Connections: Not on file    Tobacco Counseling Counseling given: Not Answered Tobacco comments: 5 cigs weekly   Clinical Intake:  Pre-visit preparation completed: Yes  Pain : No/denies pain     Nutritional Status: BMI 25 -29 Overweight Nutritional Risks: None Diabetes: No  How often do you need to have someone help you when you read instructions, pamphlets, or other written materials from your doctor or pharmacy?: 1 - Never What is the last grade level you completed in school?: 12th grade  Diabetic? no  Interpreter Needed?: No  Information entered by :: NAllen LPN   Activities of Daily Living    06/27/2021    2:07 PM  In your present state of health, do you have any difficulty performing the following activities:  Hearing? 0  Vision? 0  Difficulty concentrating or making decisions? 0  Walking or climbing stairs? 0  Dressing or bathing? 0  Doing errands, shopping? 0  Preparing Food and eating ? N  Using the Toilet? N  In the past six months, have you accidently leaked urine? N  Do you have problems with loss of bowel control? N  Managing your Medications? N  Managing your Finances? N  Housekeeping or managing your Housekeeping? N     Patient Care Team: Tonia Ghent, MD as PCP - General (Family Medicine)  Indicate any recent Medical Services you may have received from other than [facility] providers in the past year (date may be approximate).     Assessment:   This is a routine wellness examination for [place].  Hearing/Vision screen Vision Screening - Comments:: Regular eye exams, Dr. Ellin Mayhew  Dietary issues and exercise activities discussed: Current Exercise Habits: The patient does not participate in regular exercise at present   Goals Addressed  This Visit's Progress    Patient Stated       06/27/2021, wants to lose weight       Depression Screen    06/27/2021    2:07 PM 06/26/2020    2:13 PM 06/26/2019    8:29 AM 06/24/2018    8:26 AM 06/18/2017    9:04 AM  PHQ 2/9 Scores  PHQ - 2 Score 0 0 0 0 0  PHQ- 9 Score  0       Fall Risk    06/27/2021    2:07 PM 06/26/2020    2:12 PM 06/26/2019    8:29 AM 06/24/2018    8:27 AM  Fall Risk   Falls in the past year? 0 0 0 0  Number falls in past yr: 0 0  0  Injury with Fall? 0 0  0  Risk for fall due to : Medication side effect Medication side effect    Follow up Falls evaluation completed;Education provided;Falls prevention discussed Falls evaluation completed;Falls prevention discussed      FALL RISK PREVENTION PERTAINING TO THE HOME:  Any stairs in or around the home? Yes  If so, are there any without handrails? No  Home free of loose throw rugs in walkways, pet beds, electrical cords, etc? Yes  Adequate lighting in your home to reduce risk of falls? Yes   ASSISTIVE DEVICES UTILIZED TO PREVENT FALLS:  Life alert? No  Use of a cane, walker or w/c? No  Grab bars in the bathroom? No  Shower chair or bench in shower? No  Elevated toilet seat or a handicapped toilet? No   TIMED UP AND GO:  Was the test performed? No .       Cognitive Function:    06/26/2020    2:19 PM  MMSE - Mini Mental State Exam  Orientation to time  5  Orientation to Place 5  Registration 3  Attention/ Calculation 5  Recall 3  Language- repeat 1        06/27/2021    2:08 PM  6CIT Screen  What Year? 0 points  What month? 0 points  What time? 0 points  Count back from 20 0 points  Months in reverse 0 points  Repeat phrase 0 points  Total Score 0 points    Immunizations Immunization History  Administered Date(s) Administered   Fluad Quad(high Dose 65+) 10/11/2018   Influenza Whole 12/04/2003   Influenza,inj,Quad PF,6+ Mos 11/12/2017   Influenza-Unspecified 11/22/2014   Pneumococcal Conjugate-13 10/11/2018   Pneumococcal Polysaccharide-23 06/27/2020   Td 08/14/2002   Tdap 06/03/2012    TDAP status: Up to date  Flu Vaccine status: Up to date  Pneumococcal vaccine status: Up to date  Covid-19 vaccine status: Declined, Education has been provided regarding the importance of this vaccine but patient still declined. Advised may receive this vaccine at local pharmacy or Health Dept.or vaccine clinic. Aware to provide a copy of the vaccination record if obtained from local pharmacy or Health Dept. Verbalized acceptance and understanding.  Qualifies for Shingles Vaccine? Yes   Zostavax completed No   Shingrix Completed?: No.    Education has been provided regarding the importance of this vaccine. Patient has been advised to call insurance company to determine out of pocket expense if they have not yet received this vaccine. Advised may also receive vaccine at local pharmacy or Health Dept. Verbalized acceptance and understanding.  Screening Tests Health Maintenance  Topic Date Due   Zoster Vaccines- Shingrix (1  of 2) Never done   COVID-19 Vaccine (1) 07/13/2021 (Originally 06/22/1951)   MAMMOGRAM  07/15/2021   INFLUENZA VACCINE  09/02/2021   TETANUS/TDAP  06/04/2022   COLONOSCOPY (Pts 45-74yr Insurance coverage will need to be confirmed)  10/04/2029   Pneumonia Vaccine 71 Years old  Completed   DEXA SCAN  Completed    Hepatitis C Screening  Completed   HPV VACCINES  Aged Out    Health Maintenance  Health Maintenance Due  Topic Date Due   Zoster Vaccines- Shingrix (1 of 2) Never done    Colorectal cancer screening: Type of screening: Colonoscopy. Completed 10/05/2019. Repeat every 10 years  Mammogram status: Completed 07/15/2020. Repeat every year  Bone Density status: Completed 07/15/2020.   Lung Cancer Screening: (Low Dose CT Chest recommended if Age 69-80years, 30 pack-year currently smoking OR have quit w/in 15years.) does not qualify.   Lung Cancer Screening Referral: no  Additional Screening:  Hepatitis C Screening: does qualify; Completed 06/10/2015  Vision Screening: Recommended annual ophthalmology exams for early detection of glaucoma and other disorders of the eye. Is the patient up to date with their annual eye exam?  Yes  Who is the provider or what is the name of the office in which the patient attends annual eye exams? Dr. WEllin MayhewIf pt is not established with a provider, would they like to be referred to a provider to establish care? No .   Dental Screening: Recommended annual dental exams for proper oral hygiene  Community Resource Referral / Chronic Care Management: CRR required this visit?  No   CCM required this visit?  No      Plan:     I have personally reviewed and noted the following in the patient's chart:   Medical and social history Use of alcohol, tobacco or illicit drugs  Current medications and supplements including opioid prescriptions.  Functional ability and status Nutritional status Physical activity Advanced directives List of other physicians Hospitalizations, surgeries, and ER visits in previous 12 months Vitals Screenings to include cognitive, depression, and falls Referrals and appointments  In addition, I have reviewed and discussed with patient certain preventive protocols, quality metrics, and best practice recommendations. A written  personalized care plan for preventive services as well as general preventive health recommendations were provided to patient.     NKellie Simmering LPN   [id number]  Nurse Notes: none  Due to this being a virtual visit, the after visit summary with patients personalized plan was offered to patient via mail or my-chart. Patient would like to access on my-chart

## 2021-06-27 NOTE — Patient Instructions (Signed)
[name] , Thank you for taking time to come for your Medicare Wellness Visit. I appreciate your ongoing commitment to your health goals. Please review the following plan we discussed and let me know if I can assist you in the future.   Screening recommendations/referrals: Colonoscopy: completed 10/05/2019, due 10/04/2029 Mammogram: scheduled for 07/16/2021 Bone Density: completed 07/15/2020 Recommended yearly ophthalmology/optometry visit for glaucoma screening and checkup Recommended yearly dental visit for hygiene and checkup  Vaccinations: Influenza vaccine: due 09/02/2021 Pneumococcal vaccine: completed 06/27/2020 Tdap vaccine: completed 06/03/2012, due 06/04/2022 Shingles vaccine: discussed   Covid-19: decline  Advanced directives: Please bring a copy of your POA (Power of Attorney) and/or Living Will to your next appointment.   Conditions/risks identified: none  Next appointment: Follow up in one year for your annual wellness visit    Preventive Care 65 Years and Older, Female Preventive care refers to lifestyle choices and visits with your health care provider that can promote health and wellness. What does preventive care include? A yearly physical exam. This is also called an annual well check. Dental exams once or twice a year. Routine eye exams. Ask your health care provider how often you should have your eyes checked. Personal lifestyle choices, including: Daily care of your teeth and gums. Regular physical activity. Eating a healthy diet. Avoiding tobacco and drug use. Limiting alcohol use. Practicing safe sex. Taking low-dose aspirin every day. Taking vitamin and mineral supplements as recommended by your health care provider. What happens during an annual well check? The services and screenings done by your health care provider during your annual well check will depend on your age, overall health, lifestyle risk factors, and family history of disease. Counseling  Your  health care provider may ask you questions about your: Alcohol use. Tobacco use. Drug use. Emotional well-being. Home and relationship well-being. Sexual activity. Eating habits. History of falls. Memory and ability to understand (cognition). Work and work Statistician. Reproductive health. Screening  You may have the following tests or measurements: Height, weight, and BMI. Blood pressure. Lipid and cholesterol levels. These may be checked every 5 years, or more frequently if you are over 37 years old. Skin check. Lung cancer screening. You may have this screening every year starting at age 73 if you have a 30-pack-year history of smoking and currently smoke or have quit within the past 15 years. Fecal occult blood test (FOBT) of the stool. You may have this test every year starting at age 37. Flexible sigmoidoscopy or colonoscopy. You may have a sigmoidoscopy every 5 years or a colonoscopy every 10 years starting at age 97. Hepatitis C blood test. Hepatitis B blood test. Sexually transmitted disease (STD) testing. Diabetes screening. This is done by checking your blood sugar (glucose) after you have not eaten for a while (fasting). You may have this done every 1-3 years. Bone density scan. This is done to screen for osteoporosis. You may have this done starting at age 18. Mammogram. This may be done every 1-2 years. Talk to your health care provider about how often you should have regular mammograms. Talk with your health care provider about your test results, treatment options, and if necessary, the need for more tests. Vaccines  Your health care provider may recommend certain vaccines, such as: Influenza vaccine. This is recommended every year. Tetanus, diphtheria, and acellular pertussis (Tdap, Td) vaccine. You may need a Td booster every 10 years. Zoster vaccine. You may need this after age 70. Pneumococcal 13-valent conjugate (PCV13) vaccine. One  dose is recommended after age  21. Pneumococcal polysaccharide (PPSV23) vaccine. One dose is recommended after age 20. Talk to your health care provider about which screenings and vaccines you need and how often you need them. This information is not intended to replace advice given to you by your health care provider. Make sure you discuss any questions you have with your health care provider. Document Released: 02/15/2015 Document Revised: 10/09/2015 Document Reviewed: 11/20/2014 Elsevier Interactive Patient Education  2017 [place] Prevention in the Home Falls can cause injuries. They can happen to people of all ages. There are many things you can do to make your home safe and to help prevent falls. What can I do on the outside of my home? Regularly fix the edges of walkways and driveways and fix any cracks. Remove anything that might make you trip as you walk through a door, such as a raised step or threshold. Trim any bushes or trees on the path to your home. Use bright outdoor lighting. Clear any walking paths of anything that might make someone trip, such as rocks or tools. Regularly check to see if handrails are loose or broken. Make sure that both sides of any steps have handrails. Any raised decks and porches should have guardrails on the edges. Have any leaves, snow, or ice cleared regularly. Use sand or salt on walking paths during winter. Clean up any spills in your garage right away. This includes oil or grease spills. What can I do in the bathroom? Use night lights. Install grab bars by the toilet and in the tub and shower. Do not use towel bars as grab bars. Use non-skid mats or decals in the tub or shower. If you need to sit down in the shower, use a plastic, non-slip stool. Keep the floor dry. Clean up any water that spills on the floor as soon as it happens. Remove soap buildup in the tub or shower regularly. Attach bath mats securely with double-sided non-slip rug tape. Do not have throw  rugs and other things on the floor that can make you trip. What can I do in the bedroom? Use night lights. Make sure that you have a light by your bed that is easy to reach. Do not use any sheets or blankets that are too big for your bed. They should not hang down onto the floor. Have a firm chair that has side arms. You can use this for support while you get dressed. Do not have throw rugs and other things on the floor that can make you trip. What can I do in the kitchen? Clean up any spills right away. Avoid walking on wet floors. Keep items that you use a lot in easy-to-reach places. If you need to reach something above you, use a strong step stool that has a grab bar. Keep electrical cords out of the way. Do not use floor polish or wax that makes floors slippery. If you must use wax, use non-skid floor wax. Do not have throw rugs and other things on the floor that can make you trip. What can I do with my stairs? Do not leave any items on the stairs. Make sure that there are handrails on both sides of the stairs and use them. Fix handrails that are broken or loose. Make sure that handrails are as long as the stairways. Check any carpeting to make sure that it is firmly attached to the stairs. Fix any carpet that is loose or worn.  Avoid having throw rugs at the top or bottom of the stairs. If you do have throw rugs, attach them to the floor with carpet tape. Make sure that you have a light switch at the top of the stairs and the bottom of the stairs. If you do not have them, ask someone to add them for you. What else can I do to help prevent falls? Wear shoes that: Do not have high heels. Have rubber bottoms. Are comfortable and fit you well. Are closed at the toe. Do not wear sandals. If you use a stepladder: Make sure that it is fully opened. Do not climb a closed stepladder. Make sure that both sides of the stepladder are locked into place. Ask someone to hold it for you, if  possible. Clearly mark and make sure that you can see: Any grab bars or handrails. First and last steps. Where the edge of each step is. Use tools that help you move around (mobility aids) if they are needed. These include: Canes. Walkers. Scooters. Crutches. Turn on the lights when you go into a dark area. Replace any light bulbs as soon as they burn out. Set up your furniture so you have a clear path. Avoid moving your furniture around. If any of your floors are uneven, fix them. If there are any pets around you, be aware of where they are. Review your medicines with your doctor. Some medicines can make you feel dizzy. This can increase your chance of falling. Ask your doctor what other things that you can do to help prevent falls. This information is not intended to replace advice given to you by your health care provider. Make sure you discuss any questions you have with your health care provider. Document Released: 11/15/2008 Document Revised: 06/27/2015 Document Reviewed: 02/23/2014 Elsevier Interactive Patient Education  2017 [employer].

## 2021-07-01 ENCOUNTER — Ambulatory Visit (INDEPENDENT_AMBULATORY_CARE_PROVIDER_SITE_OTHER): Payer: Medicare HMO | Admitting: Family Medicine

## 2021-07-01 ENCOUNTER — Encounter: Payer: Self-pay | Admitting: Family Medicine

## 2021-07-01 ENCOUNTER — Encounter: Payer: Medicare HMO | Admitting: Family Medicine

## 2021-07-01 VITALS — BP 140/80 | HR 82 | Temp 97.6°F | Ht 63.0 in | Wt 158.0 lb

## 2021-07-01 DIAGNOSIS — M810 Age-related osteoporosis without current pathological fracture: Secondary | ICD-10-CM | POA: Diagnosis not present

## 2021-07-01 DIAGNOSIS — Z0000 Encounter for general adult medical examination without abnormal findings: Secondary | ICD-10-CM

## 2021-07-01 DIAGNOSIS — Z7189 Other specified counseling: Secondary | ICD-10-CM

## 2021-07-01 DIAGNOSIS — I10 Essential (primary) hypertension: Secondary | ICD-10-CM | POA: Diagnosis not present

## 2021-07-01 DIAGNOSIS — E559 Vitamin D deficiency, unspecified: Secondary | ICD-10-CM | POA: Diagnosis not present

## 2021-07-01 DIAGNOSIS — R0989 Other specified symptoms and signs involving the circulatory and respiratory systems: Secondary | ICD-10-CM | POA: Diagnosis not present

## 2021-07-01 DIAGNOSIS — E7800 Pure hypercholesterolemia, unspecified: Secondary | ICD-10-CM | POA: Diagnosis not present

## 2021-07-01 LAB — COMPREHENSIVE METABOLIC PANEL
ALT: 31 U/L (ref 0–35)
AST: 24 U/L (ref 0–37)
Albumin: 4.7 g/dL (ref 3.5–5.2)
Alkaline Phosphatase: 49 U/L (ref 39–117)
BUN: 22 mg/dL (ref 6–23)
CO2: 30 mEq/L (ref 19–32)
Calcium: 10.1 mg/dL (ref 8.4–10.5)
Chloride: 99 mEq/L (ref 96–112)
Creatinine, Ser: 0.69 mg/dL (ref 0.40–1.20)
GFR: 89.11 mL/min (ref >60.00)
Glucose, Bld: 132 mg/dL — ABNORMAL HIGH (ref 70–99)
Potassium: 3.6 mEq/L (ref 3.5–5.1)
Sodium: 139 mEq/L (ref 135–145)
Total Bilirubin: 0.5 mg/dL (ref 0.2–1.2)
Total Protein: 7.3 g/dL (ref 6.0–8.3)

## 2021-07-01 LAB — LIPID PANEL
Cholesterol: 190 mg/dL (ref 0–200)
HDL: 38.2 mg/dL — ABNORMAL LOW (ref >39.00)
NonHDL: 151.91
Total CHOL/HDL Ratio: 5
Triglycerides: 213 mg/dL — ABNORMAL HIGH (ref 0.0–149.0)
VLDL: 42.6 mg/dL — ABNORMAL HIGH (ref 0.0–40.0)

## 2021-07-01 LAB — VITAMIN D 25 HYDROXY (VIT D DEFICIENCY, FRACTURES): VITD: 49.41 ng/mL (ref 30.00–100.00)

## 2021-07-01 LAB — LDL CHOLESTEROL, DIRECT: Direct LDL: 132 mg/dL

## 2021-07-01 MED ORDER — POTASSIUM CHLORIDE ER 10 MEQ PO TBCR: 10.0000 meq | EXTENDED_RELEASE_TABLET | Freq: Every day | ORAL | 3 refills | Status: DC

## 2021-07-01 MED ORDER — TRIAMTERENE-HCTZ 37.5-25 MG PO TABS: 1.0000 | ORAL_TABLET | Freq: Every day | ORAL | 3 refills | Status: DC

## 2021-07-01 MED ORDER — LORATADINE 10 MG PO TABS: 10.0000 mg | ORAL_TABLET | Freq: Every day | ORAL | Status: AC | PRN

## 2021-07-01 MED ORDER — SIMVASTATIN 40 MG PO TABS: 40.0000 mg | ORAL_TABLET | Freq: Every day | ORAL | 3 refills | Status: DC

## 2021-07-01 NOTE — Progress Notes (Unsigned)
Carotid consideration d/w pt.  H/o bruit.  See exam below, d/w pt about rationale for eval with u/s.  She doesn't have to get u/s done but I encouraged her to go and discussed that in detail.    Hypertension:    Using medication without problems or lightheadedness: yes Chest pain with exertion:no Edema:no Short of breath:no Labs pending.   Elevated Cholesterol: Using medications without problems: yes Muscle aches: no Diet compliance: d/w pt.   Exercise: d/w pt.    Osteoporosis.  D/w pt about prev DXA.  Vit D pending.  See avs.    Tetanus 2014  Flu shot prev done Shingles shot d/w pt.  PNA shot 2022 covid vaccine d/w pt.   colonoscopy 2021 Mammogram 2022 DXA ordered 2022 Pap not due.   Living will d/w pt. Would have Norvel Richards designated if she were incapacitated.  Diet and exercise encouraged. HIV and HCV screening done 2017.   PMH and SH reviewed  Meds, vitals, and allergies reviewed.   ROS: Per HPI unless specifically indicated in ROS section   GEN: nad, alert and oriented HEENT: ncat NECK: supple w/o LA,, R carotid bruit noted, not on the L.   CV: rrr. PULM: ctab, no inc wob ABD: soft, +bs EXT: no edema SKIN: no acute rash

## 2021-07-01 NOTE — Patient Instructions (Addendum)
Ask the pharmacy about getting shingrix, specifically with your allergy list.  Take care.  Glad to see you. Let's get your vitamin D level back.  If it is normal, then it would be worth having a conversation about potential osteoporosis medication use.  We could do that be phone.   Go to the lab on the way out.   If you have mychart we'll likely use that to update you.    I would go for the carotid ultrasound.  Let me know if you want to get that scheduled.

## 2021-07-02 ENCOUNTER — Other Ambulatory Visit: Payer: Self-pay | Admitting: Family Medicine

## 2021-07-02 DIAGNOSIS — R739 Hyperglycemia, unspecified: Secondary | ICD-10-CM

## 2021-07-02 NOTE — Assessment & Plan Note (Signed)
  Osteoporosis.  D/w pt about prev DXA.  Vit D pending.  See avs.

## 2021-07-02 NOTE — Assessment & Plan Note (Signed)
Continue simvastatin.  See notes on labs. 

## 2021-07-02 NOTE — Assessment & Plan Note (Signed)
Tetanus 2014  Flu shot prev done Shingles shot d/w pt.  PNA shot 2022 covid vaccine d/w pt.   colonoscopy 2021 Mammogram 2022 DXA ordered 2022 Pap not due.   Living will d/w pt. Would have Norvel Richards designated if she were incapacitated.  Diet and exercise encouraged. HIV and HCV screening done 2017.

## 2021-07-02 NOTE — Assessment & Plan Note (Signed)
Carotid consideration d/w pt.  H/o bruit.  See exam below, d/w pt about rationale for eval with u/s.  She doesn't have to get u/s done but I encouraged her to go and discussed that in detail.

## 2021-07-02 NOTE — Assessment & Plan Note (Signed)
Living will d/w pt. Would have Selena Beasley designated if she were incapacitated.

## 2021-07-02 NOTE — Assessment & Plan Note (Signed)
Continue maxzide.  See notes on labs.

## 2021-07-23 DIAGNOSIS — Z1231 Encounter for screening mammogram for malignant neoplasm of breast: Secondary | ICD-10-CM | POA: Diagnosis not present

## 2021-12-10 DIAGNOSIS — L578 Other skin changes due to chronic exposure to nonionizing radiation: Secondary | ICD-10-CM | POA: Diagnosis not present

## 2021-12-10 DIAGNOSIS — D225 Melanocytic nevi of trunk: Secondary | ICD-10-CM | POA: Diagnosis not present

## 2021-12-10 DIAGNOSIS — L538 Other specified erythematous conditions: Secondary | ICD-10-CM | POA: Diagnosis not present

## 2021-12-10 DIAGNOSIS — R208 Other disturbances of skin sensation: Secondary | ICD-10-CM | POA: Diagnosis not present

## 2021-12-10 DIAGNOSIS — L820 Inflamed seborrheic keratosis: Secondary | ICD-10-CM | POA: Diagnosis not present

## 2021-12-10 DIAGNOSIS — D2262 Melanocytic nevi of left upper limb, including shoulder: Secondary | ICD-10-CM | POA: Diagnosis not present

## 2021-12-10 DIAGNOSIS — Z85828 Personal history of other malignant neoplasm of skin: Secondary | ICD-10-CM | POA: Diagnosis not present

## 2022-06-11 DIAGNOSIS — D2272 Melanocytic nevi of left lower limb, including hip: Secondary | ICD-10-CM | POA: Diagnosis not present

## 2022-06-11 DIAGNOSIS — L821 Other seborrheic keratosis: Secondary | ICD-10-CM | POA: Diagnosis not present

## 2022-06-11 DIAGNOSIS — Z08 Encounter for follow-up examination after completed treatment for malignant neoplasm: Secondary | ICD-10-CM | POA: Diagnosis not present

## 2022-06-11 DIAGNOSIS — D2271 Melanocytic nevi of right lower limb, including hip: Secondary | ICD-10-CM | POA: Diagnosis not present

## 2022-06-11 DIAGNOSIS — D2261 Melanocytic nevi of right upper limb, including shoulder: Secondary | ICD-10-CM | POA: Diagnosis not present

## 2022-06-11 DIAGNOSIS — D225 Melanocytic nevi of trunk: Secondary | ICD-10-CM | POA: Diagnosis not present

## 2022-06-11 DIAGNOSIS — L538 Other specified erythematous conditions: Secondary | ICD-10-CM | POA: Diagnosis not present

## 2022-06-11 DIAGNOSIS — Z85828 Personal history of other malignant neoplasm of skin: Secondary | ICD-10-CM | POA: Diagnosis not present

## 2022-06-11 DIAGNOSIS — D2262 Melanocytic nevi of left upper limb, including shoulder: Secondary | ICD-10-CM | POA: Diagnosis not present

## 2022-06-11 DIAGNOSIS — L570 Actinic keratosis: Secondary | ICD-10-CM | POA: Diagnosis not present

## 2022-06-11 DIAGNOSIS — L820 Inflamed seborrheic keratosis: Secondary | ICD-10-CM | POA: Diagnosis not present

## 2022-06-11 DIAGNOSIS — L578 Other skin changes due to chronic exposure to nonionizing radiation: Secondary | ICD-10-CM | POA: Diagnosis not present

## 2022-07-06 ENCOUNTER — Encounter: Payer: Self-pay | Admitting: Family Medicine

## 2022-07-06 ENCOUNTER — Ambulatory Visit (INDEPENDENT_AMBULATORY_CARE_PROVIDER_SITE_OTHER): Payer: Medicare HMO | Admitting: Family Medicine

## 2022-07-06 VITALS — BP 124/80 | HR 85 | Temp 97.7°F | Ht 63.0 in | Wt 161.0 lb

## 2022-07-06 DIAGNOSIS — M810 Age-related osteoporosis without current pathological fracture: Secondary | ICD-10-CM | POA: Diagnosis not present

## 2022-07-06 DIAGNOSIS — Z7189 Other specified counseling: Secondary | ICD-10-CM

## 2022-07-06 DIAGNOSIS — I10 Essential (primary) hypertension: Secondary | ICD-10-CM

## 2022-07-06 DIAGNOSIS — E7800 Pure hypercholesterolemia, unspecified: Secondary | ICD-10-CM | POA: Diagnosis not present

## 2022-07-06 DIAGNOSIS — R739 Hyperglycemia, unspecified: Secondary | ICD-10-CM | POA: Diagnosis not present

## 2022-07-06 DIAGNOSIS — Z0000 Encounter for general adult medical examination without abnormal findings: Secondary | ICD-10-CM | POA: Diagnosis not present

## 2022-07-06 LAB — COMPREHENSIVE METABOLIC PANEL
ALT: 58 U/L — ABNORMAL HIGH (ref 0–35)
AST: 51 U/L — ABNORMAL HIGH (ref 0–37)
Albumin: 4.7 g/dL (ref 3.5–5.2)
Alkaline Phosphatase: 56 U/L (ref 39–117)
BUN: 14 mg/dL (ref 6–23)
CO2: 29 mEq/L (ref 19–32)
Calcium: 11 mg/dL — ABNORMAL HIGH (ref 8.4–10.5)
Chloride: 97 mEq/L (ref 96–112)
Creatinine, Ser: 0.72 mg/dL (ref 0.40–1.20)
GFR: 85.24 mL/min (ref >60.00)
Glucose, Bld: 138 mg/dL — ABNORMAL HIGH (ref 70–99)
Potassium: 4 mEq/L (ref 3.5–5.1)
Sodium: 138 mEq/L (ref 135–145)
Total Bilirubin: 0.4 mg/dL (ref 0.2–1.2)
Total Protein: 7.8 g/dL (ref 6.0–8.3)

## 2022-07-06 LAB — VITAMIN D 25 HYDROXY (VIT D DEFICIENCY, FRACTURES): VITD: 65.92 ng/mL (ref 30.00–100.00)

## 2022-07-06 LAB — LIPID PANEL
Cholesterol: 205 mg/dL — ABNORMAL HIGH (ref 0–200)
HDL: 40 mg/dL (ref >39.00)
NonHDL: 164.95
Total CHOL/HDL Ratio: 5
Triglycerides: 212 mg/dL — ABNORMAL HIGH (ref 0.0–149.0)
VLDL: 42.4 mg/dL — ABNORMAL HIGH (ref 0.0–40.0)

## 2022-07-06 LAB — HEMOGLOBIN A1C: Hgb A1c MFr Bld: 7.9 % — ABNORMAL HIGH (ref 4.6–6.5)

## 2022-07-06 LAB — LDL CHOLESTEROL, DIRECT: Direct LDL: 156 mg/dL

## 2022-07-06 MED ORDER — POTASSIUM CHLORIDE ER 10 MEQ PO TBCR
10.0000 meq | EXTENDED_RELEASE_TABLET | Freq: Every day | ORAL | 3 refills | Status: DC
Start: 2022-07-06 — End: 2023-07-08

## 2022-07-06 MED ORDER — TRIAMTERENE-HCTZ 37.5-25 MG PO TABS
1.0000 | ORAL_TABLET | Freq: Every day | ORAL | 3 refills | Status: DC
Start: 2022-07-06 — End: 2023-07-08

## 2022-07-06 MED ORDER — SIMVASTATIN 40 MG PO TABS: 40.0000 mg | ORAL_TABLET | Freq: Every day | ORAL | 3 refills | Status: DC

## 2022-07-06 NOTE — Progress Notes (Unsigned)
I have personally reviewed the Medicare Annual Wellness questionnaire and have noted 1. The patient's medical and social history 2. Their use of alcohol, tobacco or illicit drugs 3. Their current medications and supplements 4. The patient's functional ability including ADL's, fall risks, home safety risks and hearing or visual             impairment. 5. Diet and physical activities 6. Evidence for depression or mood disorders  The patients weight, height, BMI have been recorded in the chart and visual acuity is per eye clinic.  I have made referrals, counseling and provided education to the patient based review of the above and I have provided the pt with a written personalized care plan for preventive services.  Provider list updated- see scanned forms.  Routine anticipatory guidance given to patient.  See health maintenance. The possibility exists that previously documented standard health maintenance information may have been brought forward from a previous encounter into this note.  If needed, that same information has been updated to reflect the current situation based on today's encounter.    Flu Shingles PNA Tetanus Colon  Breast cancer screening Prostate cancer screening Living will d/w pt. Would have Selena Beasley designated if she were incapacitated.  Cognitive function addressed- see scanned forms- and if abnormal then additional documentation follows.   In addition to [facility], follow up visit for the below conditions:  Hypertension:    Using medication without problems or lightheadedness: yes Chest pain with exertion:no Edema:no Short of breath:no  Elevated Cholesterol: Using medications without problems: yes Muscle aches: no Diet compliance: d/w pt.   Exercise: d/w pt.    PMH and SH reviewed  Meds, vitals, and allergies reviewed.   ROS: Per HPI.  Unless specifically indicated otherwise in HPI, the patient denies:  General: fever. Eyes: acute vision  changes ENT: sore throat Cardiovascular: chest pain Respiratory: SOB GI: vomiting GU: dysuria Musculoskeletal: acute back pain Derm: acute rash Neuro: acute motor dysfunction Psych: worsening mood Endocrine: polydipsia Heme: bleeding Allergy: hayfever  GEN: nad, alert and oriented HEENT: mucous membranes moist NECK: supple w/o LA CV: rrr. PULM: ctab, no inc wob ABD: soft, +bs EXT: no edema SKIN: well perfused.

## 2022-07-06 NOTE — Patient Instructions (Addendum)
I would get a flu shot each fall.   Shingles/Tetanus may be cheaper at the pharmacy.   Go to the lab on the way out.   If you have mychart we'll likely use that to update you.    Take care.  Glad to see you.  Let me know if you want to talk about osteoporosis treatment or if you want to repeat your bone density test.

## 2022-07-09 NOTE — Assessment & Plan Note (Signed)
Flu discussed with patient, encouraged. Shingles discussed with patient PNA previously done Tetanus 2014, discussed with patient. Colonoscopy 2021 Breast cancer screening pending 2024 DXA options discussed with patient.  She will consider follow-up bone density test. Living will d/w pt. Would have [name] designated if she were incapacitated.  Cognitive function addressed- see scanned forms- and if abnormal then additional documentation follows.

## 2022-07-09 NOTE — Assessment & Plan Note (Signed)
Encouraged consideration for treatment/follow-up imaging.  Discussed with patient.  Rationale for treatment i.e. decrease in fracture risk, discussed.

## 2022-07-09 NOTE — Assessment & Plan Note (Signed)
Living will d/w pt. Would have Michael Minnis designated if she were incapacitated.  

## 2022-07-09 NOTE — Assessment & Plan Note (Signed)
Continue simvastatin.  See notes on labs.  Continue work on diet and exercise. ?

## 2022-07-09 NOTE — Assessment & Plan Note (Signed)
Continue triamterene hydrochlorothiazide and potassium.  See notes on labs.

## 2022-07-12 ENCOUNTER — Other Ambulatory Visit: Payer: Self-pay | Admitting: Family Medicine

## 2022-07-12 ENCOUNTER — Encounter: Payer: Self-pay | Admitting: Family Medicine

## 2022-07-12 DIAGNOSIS — R7989 Other specified abnormal findings of blood chemistry: Secondary | ICD-10-CM

## 2022-07-12 DIAGNOSIS — E1169 Type 2 diabetes mellitus with other specified complication: Secondary | ICD-10-CM | POA: Insufficient documentation

## 2022-07-12 DIAGNOSIS — E119 Type 2 diabetes mellitus without complications: Secondary | ICD-10-CM | POA: Insufficient documentation

## 2022-07-17 ENCOUNTER — Encounter: Payer: Self-pay | Admitting: *Deleted

## 2022-07-27 ENCOUNTER — Encounter: Payer: Self-pay | Admitting: Family Medicine

## 2022-07-27 DIAGNOSIS — Z1231 Encounter for screening mammogram for malignant neoplasm of breast: Secondary | ICD-10-CM | POA: Diagnosis not present

## 2022-07-27 LAB — HM MAMMOGRAPHY

## 2022-07-27 NOTE — Telephone Encounter (Signed)
Patient is not wanting to have this done at this time.   Referral closed at this time. If she decides to have it done at a later date we can reopen this order.   FYI

## 2022-10-15 DIAGNOSIS — H0288B Meibomian gland dysfunction left eye, upper and lower eyelids: Secondary | ICD-10-CM | POA: Diagnosis not present

## 2022-10-15 DIAGNOSIS — H04123 Dry eye syndrome of bilateral lacrimal glands: Secondary | ICD-10-CM | POA: Diagnosis not present

## 2022-10-15 DIAGNOSIS — H0288A Meibomian gland dysfunction right eye, upper and lower eyelids: Secondary | ICD-10-CM | POA: Diagnosis not present

## 2022-10-15 DIAGNOSIS — H2513 Age-related nuclear cataract, bilateral: Secondary | ICD-10-CM | POA: Diagnosis not present

## 2022-11-05 DIAGNOSIS — H0288A Meibomian gland dysfunction right eye, upper and lower eyelids: Secondary | ICD-10-CM | POA: Diagnosis not present

## 2022-11-05 DIAGNOSIS — H0288B Meibomian gland dysfunction left eye, upper and lower eyelids: Secondary | ICD-10-CM | POA: Diagnosis not present

## 2022-11-05 DIAGNOSIS — H2513 Age-related nuclear cataract, bilateral: Secondary | ICD-10-CM | POA: Diagnosis not present

## 2022-11-05 DIAGNOSIS — H04123 Dry eye syndrome of bilateral lacrimal glands: Secondary | ICD-10-CM | POA: Diagnosis not present

## 2022-11-05 DIAGNOSIS — Z0100 Encounter for examination of eyes and vision without abnormal findings: Secondary | ICD-10-CM | POA: Diagnosis not present

## 2022-12-30 DIAGNOSIS — D225 Melanocytic nevi of trunk: Secondary | ICD-10-CM | POA: Diagnosis not present

## 2022-12-30 DIAGNOSIS — D2271 Melanocytic nevi of right lower limb, including hip: Secondary | ICD-10-CM | POA: Diagnosis not present

## 2022-12-30 DIAGNOSIS — D2261 Melanocytic nevi of right upper limb, including shoulder: Secondary | ICD-10-CM | POA: Diagnosis not present

## 2022-12-30 DIAGNOSIS — L820 Inflamed seborrheic keratosis: Secondary | ICD-10-CM | POA: Diagnosis not present

## 2022-12-30 DIAGNOSIS — L538 Other specified erythematous conditions: Secondary | ICD-10-CM | POA: Diagnosis not present

## 2022-12-30 DIAGNOSIS — L821 Other seborrheic keratosis: Secondary | ICD-10-CM | POA: Diagnosis not present

## 2022-12-30 DIAGNOSIS — Z85828 Personal history of other malignant neoplasm of skin: Secondary | ICD-10-CM | POA: Diagnosis not present

## 2022-12-30 DIAGNOSIS — D2262 Melanocytic nevi of left upper limb, including shoulder: Secondary | ICD-10-CM | POA: Diagnosis not present

## 2022-12-30 DIAGNOSIS — D2272 Melanocytic nevi of left lower limb, including hip: Secondary | ICD-10-CM | POA: Diagnosis not present

## 2022-12-30 DIAGNOSIS — L578 Other skin changes due to chronic exposure to nonionizing radiation: Secondary | ICD-10-CM | POA: Diagnosis not present

## 2023-06-24 ENCOUNTER — Ambulatory Visit (INDEPENDENT_AMBULATORY_CARE_PROVIDER_SITE_OTHER)

## 2023-06-24 VITALS — BP 124/80 | Ht 63.0 in | Wt 140.0 lb

## 2023-06-24 DIAGNOSIS — Z0000 Encounter for general adult medical examination without abnormal findings: Secondary | ICD-10-CM | POA: Diagnosis not present

## 2023-06-24 NOTE — Patient Instructions (Signed)
 Ms. Yaun , Thank you for taking time out of your busy schedule to complete your Annual Wellness Visit with me. I enjoyed our conversation and look forward to speaking with you again next year. I, as well as your care team,  appreciate your ongoing commitment to your health goals. Please review the following plan we discussed and let me know if I can assist you in the future. Your Game plan/ To Do List    Referrals: None Follow up Visits: Next Medicare AWV with our clinical staff: 06/27/2024   Have you seen your provider in the last 6 months (3 months if uncontrolled diabetes)? Yes Next Office Visit with your provider: 07/08/2023  Clinician Recommendations:  Aim for 30 minutes of exercise or brisk walking, 6-8 glasses of water , and 5 servings of fruits and vegetables each day.       This is a list of the screening recommended for you and due dates:  Health Maintenance  Topic Date Due   Complete foot exam   Never done   Eye exam for diabetics  Never done   Zoster (Shingles) Vaccine (1 of 2) Never done   Yearly kidney health urinalysis for diabetes  05/12/2011   DTaP/Tdap/Td vaccine (3 - Td or Tdap) 06/04/2022   Hemoglobin A1C  01/05/2023   Yearly kidney function blood test for diabetes  07/06/2023   Mammogram  07/27/2023   Flu Shot  09/03/2023   Medicare Annual Wellness Visit  06/23/2024   Colon Cancer Screening  10/04/2029   Pneumonia Vaccine  Completed   DEXA scan (bone density measurement)  Completed   Hepatitis C Screening  Completed   HPV Vaccine  Aged Out   Meningitis B Vaccine  Aged Out   COVID-19 Vaccine  Discontinued    Advanced directives: (Copy Requested) Please bring a copy of your health care power of attorney and living will to the office to be added to your chart at your convenience. You can mail to [facility] [address] [place], [place] [address] or email to [email or web address] Advance Care Planning is important because it:  [x]   Makes sure you receive the medical care that is consistent with your values, goals, and preferences  [x]  It provides guidance to your family and loved ones and reduces their decisional burden about whether or not they are making the right decisions based on your wishes.  Follow the link provided in your after visit summary or read over the paperwork we have mailed to you to help you started getting your Advance Directives in place. If you need assistance in completing these, please reach out to us  so that we can help you!  See attachments for Preventive Care and Fall Prevention Tips.

## 2023-06-24 NOTE — Progress Notes (Signed)
 Because this visit was a virtual/telehealth visit,  certain criteria was not obtained, such a blood pressure, CBG if applicable, and timed get up and go. Any medications not marked as "taking" were not mentioned during the medication reconciliation part of the visit. Any vitals not documented were not able to be obtained due to this being a telehealth visit or patient was unable to self-report a recent blood pressure reading due to a lack of equipment at home via telehealth. Vitals that have been documented are verbally provided by the patient.   This visit was performed by a medical professional under my direct supervision. I was immediately available for consultation/collaboration. I have reviewed and agree with the Annual Wellness Visit documentation.  Subjective:   Selena Beasley is a 73 y.o. who presents for a Medicare Wellness preventive visit.  As a reminder, Annual Wellness Visits don't include a physical exam, and some assessments may be limited, especially if this visit is performed virtually. We may recommend an in-person follow-up visit with your provider if needed.  Visit Complete: Virtual I connected with  [name] on 06/24/23 by a audio enabled telemedicine application and verified that I am speaking with the correct person using two identifiers.  Patient Location: Home  Provider Location: Home Office  I discussed the limitations of evaluation and management by telemedicine. The patient expressed understanding and agreed to proceed.  Vital Signs: Because this visit was a virtual/telehealth visit, some criteria may be missing or patient reported. Any vitals not documented were not able to be obtained and vitals that have been documented are patient reported.  VideoDeclined- This patient declined Librarian, academic. Therefore the visit was completed with audio only.  Persons Participating in Visit: Patient.  AWV Questionnaire: Yes:  Patient Medicare AWV questionnaire was completed by the patient on 06/22/2023; I have confirmed that all information answered by patient is correct and no changes since this date.  Cardiac Risk Factors include: advanced age (>13men, >85 women);diabetes mellitus;hypertension     Objective:     Today's Vitals   06/24/23 0934  BP: 124/80  Weight: 140 lb (63.5 kg)  Height: 5\' 3"  (1.6 m)   Body mass index is 24.8 kg/m.     06/24/2023    9:31 AM 06/27/2021    2:06 PM 06/26/2020    2:11 PM 10/05/2019    8:53 AM 06/20/2016    7:45 PM  Advanced Directives  Does Patient Have a Medical Advance Directive? Yes Yes Yes Yes No;Yes  Type of Estate agent of [place];Living will Healthcare Power of [place];Living will Healthcare Power of [place];Living will Healthcare Power of [place];Living will Healthcare Power of [place];Living will  Does patient want to make changes to medical advance directive? No - Patient declined   No - Patient declined   Copy of Healthcare Power of Attorney in Chart? No - copy requested No - copy requested No - copy requested No - copy requested No - copy requested  Would patient like information on creating a medical advance directive?     No - Patient declined    Current Medications (verified) Outpatient Encounter Medications as of 06/24/2023  Medication Sig   cholecalciferol (VITAMIN D ) 25 MCG (1000 UNIT) tablet Take 1 tablet (1,000 Units total) by mouth daily.   loratadine  (CLARITIN ) 10 MG tablet Take 1 tablet (10 mg total) by mouth daily as needed for allergies.   Multiple Vitamin (MULTIVITAMIN) tablet Take 1 tablet by mouth daily.  potassium chloride  (KLOR-CON ) 10 MEQ tablet Take 1 tablet (10 mEq total) by mouth daily.   simvastatin  (ZOCOR ) 40 MG tablet Take 1 tablet (40 mg total) by mouth at bedtime.   triamterene -hydrochlorothiazide (MAXZIDE-25) 37.5-25 MG tablet Take 1 tablet by mouth daily.   No facility-administered encounter  medications on file as of 06/24/2023.    Allergies (verified) Bacitracin, Neomycin, Polymyxin b, and Zostavax [zoster vaccine live]   History: Past Medical History:  Diagnosis Date   HTN (hypertension)    Hyperlipidemia    Wears dentures    full upper and lower   Past Surgical History:  Procedure Laterality Date   COLONOSCOPY WITH PROPOFOL  N/A 10/05/2019   Procedure: COLONOSCOPY WITH PROPOFOL ;  Surgeon: Marnee Sink, MD;  [facility];  Service: Endoscopy;  Laterality: N/A;   MOHS SURGERY     10/2020, right forehead   NSVD     x3 (Last 1995)   Family History  Problem Relation Age of Onset   Hypertension Mother    Cancer Mother        breast   Breast cancer Mother    Heart attack Father 84       deceased from same   Heart disease Father    Alcohol abuse Father    Diabetes Brother    Stroke Son    Diabetes Maternal Grandmother    Colon cancer Neg Hx    Social History   Socioeconomic History   Marital status: Single    Spouse name: Not on file   Number of children: 1   Years of education: Not on file   Highest education level: 12th grade  Occupational History   Occupation: Dispensing optician    Comment: @ [employer]  Tobacco Use   Smoking status: Former    Current packs/day: 0.25    Average packs/day: 0.3 packs/day for 48.0 years (12.0 ttl pk-yrs)    Types: Cigarettes   Smokeless tobacco: Never   Tobacco comments:    5 cigs weekly  Vaping Use   Vaping status: Never Used  Substance and Sexual Activity   Alcohol use: No   Drug use: No   Sexual activity: Not Currently  Other Topics Concern   Not on file  Social History Narrative   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   Social Drivers of Corporate investment banker Strain: Low Risk  (06/22/2023)   Overall Financial Resource Strain (CARDIA)    Difficulty of Paying  Living Expenses: Not very hard  Food Insecurity: No Food Insecurity (06/22/2023)   Hunger Vital Sign    Worried About Running Out of Food in the Last Year: Never true    Ran Out of Food in the Last Year: Never true  Transportation Needs: No Transportation Needs (06/22/2023)   PRAPARE - Administrator, Civil Service (Medical): No    Lack of Transportation (Non-Medical): No  Physical Activity: Sufficiently Active (06/22/2023)   Exercise Vital Sign    Days of Exercise per Week: 5 days    Minutes of Exercise per Session: 60 min  Stress: No Stress Concern Present (06/22/2023)   [employer] of Occupational Health - Occupational Stress Questionnaire    Feeling of Stress : Not at all  Social Connections: Unknown (06/22/2023)   Social Connection and Isolation Panel [NHANES]    Frequency  of Communication with Friends and Family: More than three times a week    Frequency of Social Gatherings with Friends and Family: More than three times a week    Attends Religious Services: Patient declined    Database administrator or Organizations: Patient declined    Attends Engineer, structural: Patient declined    Marital Status: Patient declined    Tobacco Counseling Counseling given: Not Answered Tobacco comments: 5 cigs weekly    Clinical Intake:  Pre-visit preparation completed: Yes  Pain : No/denies pain     BMI - recorded: 24.8 Nutritional Status: BMI of 19-24  Normal Nutritional Risks: None Diabetes: No  Lab Results  Component Value Date   HGBA1C 7.9 (H) 07/06/2022     How often do you need to have someone help you when you read instructions, pamphlets, or other written materials from your doctor or pharmacy?: 1 - Never What is the last grade level you completed in school?: Graduated HS, some college classes     Information entered by :: [employer]   Activities of Daily Living     06/22/2023    1:52 PM  In your present state of health, do  you have any difficulty performing the following activities:  Hearing? 0  Vision? 0  Difficulty concentrating or making decisions? 0  Walking or climbing stairs? 0  Dressing or bathing? 0  Doing errands, shopping? 0  Preparing Food and eating ? N  Using the Toilet? N  In the past six months, have you accidently leaked urine? N  Do you have problems with loss of bowel control? N  Managing your Medications? N  Managing your Finances? N  Housekeeping or managing your Housekeeping? N    Patient Care Team: Donnie Galea, MD as PCP - General (Family Medicine)  Indicate any recent Medical Services you may have received from other than [facility] providers in the past year (date may be approximate).     Assessment:    This is a routine wellness examination for [place].  Hearing/Vision screen Hearing Screening - Comments:: Patient has no hearing difficulties Vision Screening - Comments:: Patient wears glasses. Pt Sees Dr Orlene Bjork in [place]   Goals Addressed             This Visit's Progress    Patient Stated   On track    06/27/2021, wants to lose weight       Depression Screen     06/24/2023    9:44 AM 07/06/2022   10:13 AM 06/27/2021    2:07 PM 06/26/2020    2:13 PM 06/26/2019    8:29 AM 06/24/2018    8:26 AM 06/18/2017    9:04 AM  PHQ 2/9 Scores  PHQ - 2 Score 0 0 0 0 0 0 0  PHQ- 9 Score 0 0  0       Fall Risk     06/22/2023    1:52 PM 07/06/2022   10:13 AM 06/27/2021    2:07 PM 06/26/2020    2:12 PM 06/26/2019    8:29 AM  Fall Risk   Falls in the past year? 0 0 0 0 0  Number falls in past yr: 0 0 0 0   Injury with Fall? 0 0 0 0   Risk for fall due to : No Fall Risks No Fall Risks Medication side effect Medication side effect   Follow up Falls evaluation completed Falls evaluation completed Falls evaluation completed;Education provided;Falls  prevention discussed Falls evaluation completed;Falls prevention discussed     MEDICARE RISK AT HOME:  Medicare Risk at  Home Any stairs in or around the home?: (Patient-Rptd) No If so, are there any without handrails?: (Patient-Rptd) No Home free of loose throw rugs in walkways, pet beds, electrical cords, etc?: (Patient-Rptd) Yes Adequate lighting in your home to reduce risk of falls?: (Patient-Rptd) Yes Life alert?: (Patient-Rptd) No Use of a cane, walker or w/c?: (Patient-Rptd) No Grab bars in the bathroom?: (Patient-Rptd) Yes Shower chair or bench in shower?: (Patient-Rptd) No Elevated toilet seat or a handicapped toilet?: (Patient-Rptd) Yes  TIMED UP AND GO:  Was the test performed?  No  Cognitive Function: 6CIT completed    06/26/2020    2:19 PM  MMSE - Mini Mental State Exam  Orientation to time 5  Orientation to Place 5  Registration 3  Attention/ Calculation 5  Recall 3  Language- repeat 1        06/24/2023    9:39 AM 06/27/2021    2:08 PM  6CIT Screen  What Year? 0 points 0 points  What month? 0 points 0 points  What time? 0 points 0 points  Count back from 20 0 points 0 points  Months in reverse 0 points 0 points  Repeat phrase 0 points 0 points  Total Score 0 points 0 points    Immunizations Immunization History  Administered Date(s) Administered   Fluad Quad(high Dose 65+) 10/11/2018   Influenza Whole 12/04/2003   Influenza,inj,Quad PF,6+ Mos 11/12/2017   Influenza-Unspecified 11/22/2014   Pneumococcal Conjugate-13 10/11/2018   Pneumococcal Polysaccharide-23 06/27/2020   Td 08/14/2002   Tdap 06/03/2012    Screening Tests Health Maintenance  Topic Date Due   FOOT EXAM  Never done   OPHTHALMOLOGY EXAM  Never done   Zoster Vaccines- Shingrix (1 of 2) Never done   Diabetic kidney evaluation - Urine ACR  05/12/2011   DTaP/Tdap/Td (3 - Td or Tdap) 06/04/2022   HEMOGLOBIN A1C  01/05/2023   Diabetic kidney evaluation - eGFR measurement  07/06/2023   MAMMOGRAM  07/27/2023   INFLUENZA VACCINE  09/03/2023   Medicare Annual Wellness (AWV)  06/23/2024   Colonoscopy   10/04/2029   Pneumonia Vaccine 68+ Years old  Completed   DEXA SCAN  Completed   Hepatitis C Screening  Completed   HPV VACCINES  Aged Out   Meningococcal B Vaccine  Aged Out   COVID-19 Vaccine  Discontinued    Health Maintenance  Health Maintenance Due  Topic Date Due   FOOT EXAM  Never done   OPHTHALMOLOGY EXAM  Never done   Zoster Vaccines- Shingrix (1 of 2) Never done   Diabetic kidney evaluation - Urine ACR  05/12/2011   DTaP/Tdap/Td (3 - Td or Tdap) 06/04/2022   HEMOGLOBIN A1C  01/05/2023   Diabetic kidney evaluation - eGFR measurement  07/06/2023   Health Maintenance Items Addressed:patient declined health maintenance   Additional Screening:  Vision Screening: Recommended annual ophthalmology exams for early detection of glaucoma and other disorders of the eye.  Dental Screening: Recommended annual dental exams for proper oral hygiene  Community Resource Referral / Chronic Care Management: CRR required this visit?  No   CCM required this visit?  No   Plan:    I have personally reviewed and noted the following in the patient's chart:   Medical and social history Use of alcohol, tobacco or illicit drugs  Current medications and supplements including opioid prescriptions. Patient is not  currently taking opioid prescriptions. Functional ability and status Nutritional status Physical activity Advanced directives List of other physicians Hospitalizations, surgeries, and ER visits in previous 12 months Vitals Screenings to include cognitive, depression, and falls Referrals and appointments  In addition, I have reviewed and discussed with patient certain preventive protocols, quality metrics, and best practice recommendations. A written personalized care plan for preventive services as well as general preventive health recommendations were provided to patient.   Freeda Jerry, [place]   06/24/2023   After Visit Summary: (MyChart) Due to this being a telephonic  visit, the after visit summary with patients personalized plan was offered to patient via MyChart   Notes: Nothing significant to report at this time.

## 2023-07-08 ENCOUNTER — Ambulatory Visit: Payer: Medicare HMO | Admitting: Family Medicine

## 2023-07-08 ENCOUNTER — Encounter: Payer: Self-pay | Admitting: Family Medicine

## 2023-07-08 VITALS — BP 138/66 | HR 56 | Temp 98.6°F | Ht 63.0 in | Wt 137.0 lb

## 2023-07-08 DIAGNOSIS — E119 Type 2 diabetes mellitus without complications: Secondary | ICD-10-CM | POA: Diagnosis not present

## 2023-07-08 DIAGNOSIS — M810 Age-related osteoporosis without current pathological fracture: Secondary | ICD-10-CM | POA: Diagnosis not present

## 2023-07-08 DIAGNOSIS — I10 Essential (primary) hypertension: Secondary | ICD-10-CM

## 2023-07-08 DIAGNOSIS — E7800 Pure hypercholesterolemia, unspecified: Secondary | ICD-10-CM | POA: Diagnosis not present

## 2023-07-08 DIAGNOSIS — Z0000 Encounter for general adult medical examination without abnormal findings: Secondary | ICD-10-CM

## 2023-07-08 LAB — COMPREHENSIVE METABOLIC PANEL WITH GFR
ALT: 11 U/L (ref 0–35)
AST: 15 U/L (ref 0–37)
Albumin: 4.7 g/dL (ref 3.5–5.2)
Alkaline Phosphatase: 47 U/L (ref 39–117)
BUN: 19 mg/dL (ref 6–23)
CO2: 26 meq/L (ref 19–32)
Calcium: 9.8 mg/dL (ref 8.4–10.5)
Chloride: 101 meq/L (ref 96–112)
Creatinine, Ser: 0.61 mg/dL (ref 0.40–1.20)
GFR: 90.51 mL/min (ref >60.00)
Glucose, Bld: 118 mg/dL — ABNORMAL HIGH (ref 70–99)
Potassium: 3.7 meq/L (ref 3.5–5.1)
Sodium: 142 meq/L (ref 135–145)
Total Bilirubin: 0.5 mg/dL (ref 0.2–1.2)
Total Protein: 6.9 g/dL (ref 6.0–8.3)

## 2023-07-08 LAB — CBC WITH DIFFERENTIAL/PLATELET
Basophils Absolute: 0 10*3/uL (ref 0.0–0.1)
Basophils Relative: 0.7 % (ref 0.0–3.0)
Eosinophils Absolute: 0.1 10*3/uL (ref 0.0–0.7)
Eosinophils Relative: 1.2 % (ref 0.0–5.0)
HCT: 43.9 % (ref 36.0–46.0)
Hemoglobin: 14.9 g/dL (ref 12.0–15.0)
Lymphocytes Relative: 20.1 % (ref 12.0–46.0)
Lymphs Abs: 1.2 10*3/uL (ref 0.7–4.0)
MCHC: 33.8 g/dL (ref 30.0–36.0)
MCV: 89.3 fl (ref 78.0–100.0)
Monocytes Absolute: 0.6 10*3/uL (ref 0.1–1.0)
Monocytes Relative: 9.4 % (ref 3.0–12.0)
Neutro Abs: 4.1 10*3/uL (ref 1.4–7.7)
Neutrophils Relative %: 68.6 % (ref 43.0–77.0)
Platelets: 206 10*3/uL (ref 150.0–400.0)
RBC: 4.92 Mil/uL (ref 3.87–5.11)
RDW: 13.6 % (ref 11.5–15.5)
WBC: 5.9 10*3/uL (ref 4.0–10.5)

## 2023-07-08 LAB — LIPID PANEL
Cholesterol: 167 mg/dL (ref 0–200)
HDL: 40.3 mg/dL (ref >39.00)
LDL Cholesterol: 105 mg/dL — ABNORMAL HIGH (ref 0–99)
NonHDL: 126.55
Total CHOL/HDL Ratio: 4
Triglycerides: 110 mg/dL (ref 0.0–149.0)
VLDL: 22 mg/dL (ref 0.0–40.0)

## 2023-07-08 LAB — MICROALBUMIN / CREATININE URINE RATIO
Creatinine,U: 118.1 mg/dL
Microalb Creat Ratio: 9.7 mg/g (ref 0.0–30.0)
Microalb, Ur: 1.1 mg/dL (ref 0.0–1.9)

## 2023-07-08 LAB — HEMOGLOBIN A1C: Hgb A1c MFr Bld: 6.5 % (ref 4.6–6.5)

## 2023-07-08 LAB — VITAMIN D 25 HYDROXY (VIT D DEFICIENCY, FRACTURES): VITD: 49.01 ng/mL (ref 30.00–100.00)

## 2023-07-08 MED ORDER — POTASSIUM CHLORIDE ER 10 MEQ PO TBCR: 10.0000 meq | EXTENDED_RELEASE_TABLET | Freq: Every day | ORAL | 3 refills | Status: AC

## 2023-07-08 MED ORDER — SIMVASTATIN 40 MG PO TABS: 40.0000 mg | ORAL_TABLET | Freq: Every day | ORAL | 3 refills | Status: AC

## 2023-07-08 MED ORDER — TRIAMTERENE-HCTZ 37.5-25 MG PO TABS: 1.0000 | ORAL_TABLET | Freq: Every day | ORAL | 3 refills | Status: AC

## 2023-07-08 NOTE — Patient Instructions (Signed)
 Go to the lab on the way out.   If you have mychart we'll likely use that to update you.    If your liver tests are still elevated, then I would get the liver ultrasound done.  Take care.  Glad to see you.

## 2023-07-08 NOTE — Progress Notes (Signed)
 Flu discussed with patient, encouraged. Shingles not indicated.  PNA previously done Tetanus 2014, discussed with patient- done at [facility] in the meantime per patient report.  Previously advised to get COVID-vaccine. Colonoscopy 2021 Breast cancer screening pending 2025 DXA options discussed with patient.  She declined tx/DXA.  Living will d/w pt. Would have Dava Erichsen designated if she were incapacitated.   Her son had a stroke, d/w pt.  He is staying with patient now, out of work.  Condolences offered.  We talked about her prev labs and that she was contacted last year regarding diabetes, hypercalcemia, LFT elevation.  See plan.  She declined extra workup when she was contacted last year.  Discussed.  Labs pending from today.  Diabetes:  No meds.  Hypoglycemic episodes: no sx Hyperglycemic episodes: no sx Feet problems: no Blood Sugars averaging: not checked often. eye exam within last year: yes Orlene Bjork 2024 She has questions about taking statin.  D/w pt about indication for statin, FH CAD, h/o DM2, et.   Hypertension:    Using medication without problems or lightheadedness: yes Chest pain with exertion:no Edema:no Short of breath:no  Elevated Cholesterol: Using medications without problems: yes Muscle aches: no Diet compliance: d/w pt.   Exercise: d/w pt.    H/o intentional weight loss with diet and exercise.  She does not have alarming symptoms of another concurrent illness to explain the weight loss.  No fever/chills, etc.  PMH and SH reviewed.   Vital signs, Meds and allergies reviewed.  ROS: Per HPI unless specifically indicated in ROS section   GEN: nad, alert and oriented HEENT: ncat NECK: supple w/o LA CV: rrr PULM: ctab, no inc wob ABD: soft, +bs EXT: no edema SKIN: well perfused.   Diabetic foot exam: Normal inspection No skin breakdown No calluses  Normal DP pulses Normal sensation to light tough and monofilament Nails normal

## 2023-07-09 LAB — PTH, INTACT AND CALCIUM
Calcium: 9.5 mg/dL (ref 8.6–10.4)
PTH: 41 pg/mL (ref 16–77)

## 2023-07-11 ENCOUNTER — Ambulatory Visit: Payer: Self-pay | Admitting: Family Medicine

## 2023-07-11 ENCOUNTER — Other Ambulatory Visit: Payer: Self-pay | Admitting: Family Medicine

## 2023-07-11 DIAGNOSIS — Z0000 Encounter for general adult medical examination without abnormal findings: Secondary | ICD-10-CM | POA: Insufficient documentation

## 2023-07-11 NOTE — Assessment & Plan Note (Addendum)
 Flu discussed with patient, encouraged. Shingles not indicated.  PNA previously done Tetanus 2014, discussed with patient- done at [facility] in the meantime per patient report.  Previously advised to get COVID-vaccine. Colonoscopy 2021 Breast cancer screening pending 2025 DXA options discussed with patient.  She declined tx/DXA.  Living will d/w pt. Would have Selena Beasley designated if she were incapacitated.   I started the office visit by telling the patient that I was glad to see her.  She was frowning and appeared unhappy throughout the interview.  She told me, "I'm aggravated."  I thought she was understandably upset about her son's stroke.  Condolences offered.  She told me she had 3 other concerns  1.  She has been upset since I gave her the advice to get vaccinated for COVID during the pandemic. 2.  She thinks that I do not listen to her. 3.  She reports not being adequately contacted about her labs last year.  "I got some call on a Sunday morning and I do not know what that was about."  Discussed her complaints 1.  She has been given routine and appropriate evidence-based medical advice when she comes in.  She can disregard that if she chooses. 2.  I have listened to her comments and questions at every patient encounter. 3.  She was called by staff on 07/13/2022 12:57 PM EDT.  That was a Monday afternoon.  She was called by staff and given clear instructions based on the result note.  She appeared so upset throughout the interview that midway I gave her the option to leave the clinic without finishing the encounter.  She opted to stay and complete the visit.  I asked her if she wanted to have a refill sent and get her labs done today.  She opted for both.  We discussed that she has the option to follow up or not follow up here.  She has the option to disregard all medical advice.  If she disagrees with the medical care here she can always seek a second opinion.  If she is unhappy  with her care here she can establish at another clinic.  I told her that I did not know if I would ever see her again-that depends on her willingness to follow-up here in the clinic-but either way I wish her the best.

## 2023-07-11 NOTE — Assessment & Plan Note (Signed)
 See above.  Follow-up labs pending.  Declined eval after abnormal labs last year.

## 2023-07-11 NOTE — Assessment & Plan Note (Signed)
 Would continue simvastatin .  See notes on labs.

## 2023-07-11 NOTE — Assessment & Plan Note (Signed)
 Advised to continue statin.  Follow-up labs pending.

## 2023-07-11 NOTE — Assessment & Plan Note (Signed)
 Continue work on diet and exercise.  See notes on labs.  Continue triamterene  hydrochlorothiazide for now.

## 2023-07-12 ENCOUNTER — Encounter: Payer: Self-pay | Admitting: Family Medicine

## 2023-07-18 ENCOUNTER — Ambulatory Visit: Payer: Self-pay | Admitting: Family Medicine

## 2023-07-19 NOTE — Telephone Encounter (Signed)
 Copied from CRM [id number]. Topic: Appointments - Transfer of Care >> Jul 19, 2023  1:38 PM Tiffini S wrote: Pt is requesting to transfer FROM: Richrd Char  Pt is requesting to transfer TO: Laroy Plunk Reason for requested transfer: New Patient, disagreement with provider It is the responsibility of the team the patient would like to transfer to (Dr. Larkin Plumb, Bearl Limes) to reach out to the patient if for any reason this transfer is not acceptable.

## 2023-07-19 NOTE — Telephone Encounter (Signed)
 Copied from CRM [id number]. Topic: Appointments - Transfer of Care >> Jul 19, 2023  1:32 PM Tiffini S wrote: Pt is requesting to transfer FROM: Richrd Char Pt is requesting to transfer TO: Laroy Plunk Reason for requested transfer: New Patient, disagreement with provider  It is the responsibility of the team the patient would like to transfer to (Dr. Larkin Plumb, Bearl Limes) to reach out to the patient if for any reason this transfer is not acceptable.

## 2023-07-28 ENCOUNTER — Encounter: Payer: Self-pay | Admitting: Family Medicine

## 2023-07-28 ENCOUNTER — Ambulatory Visit: Payer: Self-pay | Admitting: Family Medicine

## 2023-07-28 DIAGNOSIS — Z1231 Encounter for screening mammogram for malignant neoplasm of breast: Secondary | ICD-10-CM | POA: Diagnosis not present

## 2023-07-28 LAB — HM MAMMOGRAPHY

## 2023-08-12 ENCOUNTER — Ambulatory Visit: Admitting: Physician Assistant

## 2023-08-12 ENCOUNTER — Encounter: Payer: Self-pay | Admitting: Physician Assistant

## 2023-08-12 VITALS — BP 166/66 | HR 77 | Temp 98.2°F | Ht 63.0 in | Wt 134.0 lb

## 2023-08-12 DIAGNOSIS — I10 Essential (primary) hypertension: Secondary | ICD-10-CM

## 2023-08-12 DIAGNOSIS — E1169 Type 2 diabetes mellitus with other specified complication: Secondary | ICD-10-CM

## 2023-08-12 DIAGNOSIS — R0989 Other specified symptoms and signs involving the circulatory and respiratory systems: Secondary | ICD-10-CM | POA: Diagnosis not present

## 2023-08-12 DIAGNOSIS — E785 Hyperlipidemia, unspecified: Secondary | ICD-10-CM | POA: Diagnosis not present

## 2023-08-12 NOTE — Assessment & Plan Note (Addendum)
 Congratulated on progress so far. Okay to continue without pharmacotherapy for this problem.   Recommend diet low in simple carbohydrates such as white starches (bread, pasta, rice) and refined sugar found in desserts and sweetened beverages including juice, sweet tea, and soda.

## 2023-08-12 NOTE — Assessment & Plan Note (Signed)
 Endorses whitecoat HTN, a little anxious today due to first visit here. No change to pharmacotherapy at this time, but encouraged careful home BP monitoring. She is to let me know if her readings are consistently >140 systolic. Verbalizes understanding.

## 2023-08-12 NOTE — Assessment & Plan Note (Signed)
 Patient agreeable to evaluate with bilateral carotid duplex ultrasound, but does not want to schedule this until September because she has a lot going on.  Submitting the order with scheduling instructions for Sept.

## 2023-08-12 NOTE — Progress Notes (Signed)
 Date:  08/12/2023   Name:  Selena Beasley   DOB:  [birth year]   MRN:  [id number]   Chief Complaint: Establish Care  HPI Selena Beasley is a delightful 72 year old female with history of HTN, diet-controlled DM2, HLD, osteoporosis, and right-sided carotid bruit new to the practice today to establish care.  She has no particular complaints today, primarily wanted to meet me and our staff for ongoing care.   Recently seen by her former PCP Dr. Cleatus last month with labs completed at that time, I have reviewed these in their entirety.  Importantly, through lifestyle changes alone she has driven her A1c down to 6.5% compared to 7.9% last year.  Lipid control is fair, with LDL 105 last month on simvastatin  40 mg.   Son is s/p stroke and living in the home with her, struggles with diplopia but they are working with a neuro-optho for this. Also her partner's sister passed away so she is dealing with the estate from her passing, which is stressful and time-consuming.   Medication list has been reviewed and updated.  Current Meds  Medication Sig   Cholecalciferol (VITAMIN D3 PO) Take by mouth.   loratadine  (CLARITIN ) 10 MG tablet Take 1 tablet (10 mg total) by mouth daily as needed for allergies.   Multiple Vitamin (MULTIVITAMIN) tablet Take 1 tablet by mouth daily.   potassium chloride  (KLOR-CON ) 10 MEQ tablet Take 1 tablet (10 mEq total) by mouth daily.   simvastatin  (ZOCOR ) 40 MG tablet Take 1 tablet (40 mg total) by mouth at bedtime.   triamterene -hydrochlorothiazide (MAXZIDE-25) 37.5-25 MG tablet Take 1 tablet by mouth daily.   [DISCONTINUED] cholecalciferol (VITAMIN D ) 25 MCG (1000 UNIT) tablet Take 1 tablet (1,000 Units total) by mouth daily.     Review of Systems  Patient Active Problem List   Diagnosis Date Noted   Diet-controlled Type 2 diabetes mellitus with hyperlipidemia (HCC) 07/12/2022   Osteoporosis 07/21/2020   Bilateral carotid bruits 07/03/2020   Vitamin D  deficiency  06/17/2016   Left shoulder pain 08/04/2015   Benign paroxysmal positional vertigo 06/05/2013   Pure hypercholesterolemia 05/12/2010   Essential hypertension 08/04/2006    Allergies  Allergen Reactions   Bacitracin     rash   Neomycin     rash   Polymyxin B Other (See Comments)    rash   Zostavax [Zoster Vaccine Live]     Would avoid due to possible neomycin allergy.     Immunization History  Administered Date(s) Administered   Fluad Quad(high Dose 65+) 10/11/2018   Influenza Whole 12/04/2003   Influenza,inj,Quad PF,6+ Mos 11/12/2017   Influenza-Unspecified 11/22/2014   Pneumococcal Conjugate-13 10/11/2018   Pneumococcal Polysaccharide-23 06/27/2020   Td 08/14/2002   Td (Adult),5 Lf Tetanus Toxid, Preservative Free 10/19/2022   Tdap 06/03/2012    Past Surgical History:  Procedure Laterality Date   COLONOSCOPY WITH PROPOFOL  N/A 10/05/2019   Procedure: COLONOSCOPY WITH PROPOFOL ;  Surgeon: Jinny Carmine, MD;  Location: [facility];  Service: Endoscopy;  Laterality: N/A;   MOHS SURGERY     10/2020, right forehead   NSVD     x3 (Last 1995)    Social History   Tobacco Use   Smoking status: Former    Current packs/day: 0.00    Average packs/day: 0.3 packs/day for 2.0 years (0.5 ttl pk-yrs)    Types: Cigarettes    Start date: 15    Quit date: 63    Years since quitting: 48.5   Smokeless  tobacco: Never   Tobacco comments:    5 cigs weekly  Vaping Use   Vaping status: Never Used  Substance Use Topics   Alcohol use: No   Drug use: No    Family History  Problem Relation Age of Onset   Hypertension Mother    Cancer Mother        breast   Breast cancer Mother    Heart attack Father 71       deceased from same   Heart disease Father    Alcohol abuse Father    Diabetes Brother    Stroke Son    Diabetes Maternal Grandmother    Diabetes Son    Colon cancer Neg Hx         08/12/2023    9:06 AM 07/08/2023    9:29 AM 07/06/2022   10:13 AM  GAD 7 :  Generalized Anxiety Score  Nervous, Anxious, on Edge 0 0 0  Control/stop worrying 0 0 0  Worry too much - different things 0 0 0  Trouble relaxing 0 0 0  Restless 0 0 0  Easily annoyed or irritable 0 0 0  Afraid - awful might happen 0 0 0  Total GAD 7 Score 0 0 0  Anxiety Difficulty Not difficult at all Not difficult at all Not difficult at all       08/12/2023    9:06 AM 07/08/2023    9:29 AM 06/24/2023    9:44 AM  Depression screen PHQ 2/9  Decreased Interest 0 0 0  Down, Depressed, Hopeless 0 0 0  PHQ - 2 Score 0 0 0  Altered sleeping  0 0  Tired, decreased energy  0 0  Change in appetite  0 0  Feeling bad or failure about yourself   0 0  Trouble concentrating  0 0  Moving slowly or fidgety/restless  0 0  Suicidal thoughts  0 0  PHQ-9 Score  0 0  Difficult doing work/chores  Not difficult at all Not difficult at all    BP Readings from Last 3 Encounters:  08/12/23 (!) 166/66  07/08/23 138/66  06/24/23 124/80    Wt Readings from Last 3 Encounters:  08/12/23 134 lb (60.8 kg)  07/08/23 137 lb (62.1 kg)  06/24/23 140 lb (63.5 kg)    BP (!) 166/66 (Cuff Size: Normal)   Pulse 77   Temp 98.2 F (36.8 C)   Ht 5' 3 (1.6 m)   Wt 134 lb (60.8 kg)   SpO2 97%   BMI 23.74 kg/m   Physical Exam Vitals and nursing note reviewed.  Constitutional:      Appearance: Normal appearance.  Neck:     Vascular: Carotid bruit (bilateral, R>L) present.  Cardiovascular:     Rate and Rhythm: Normal rate and regular rhythm.     Heart sounds: No murmur heard.    No friction rub. No gallop.  Pulmonary:     Effort: Pulmonary effort is normal.     Breath sounds: Normal breath sounds.  Abdominal:     General: There is no distension.  Musculoskeletal:        General: Normal range of motion.  Skin:    General: Skin is warm and dry.  Neurological:     Mental Status: She is alert and oriented to person, place, and time.     Gait: Gait is intact.  Psychiatric:        Mood and  Affect: Mood and affect  normal.     Recent Labs     Component Value Date/Time   NA 142 07/08/2023 1022   K 3.7 07/08/2023 1022   CL 101 07/08/2023 1022   CO2 26 07/08/2023 1022   GLUCOSE 118 (H) 07/08/2023 1022   BUN 19 07/08/2023 1022   CREATININE 0.61 07/08/2023 1022   CALCIUM 9.8 07/08/2023 1022   CALCIUM 9.5 07/08/2023 1022   PROT 6.9 07/08/2023 1022   ALBUMIN 4.7 07/08/2023 1022   AST 15 07/08/2023 1022   ALT 11 07/08/2023 1022   ALKPHOS 47 07/08/2023 1022   BILITOT 0.5 07/08/2023 1022   GFRNONAA 109.77 05/02/2009 0911   GFRAA 96 03/30/2007 0934    Lab Results  Component Value Date   WBC 5.9 07/08/2023   HGB 14.9 07/08/2023   HCT 43.9 07/08/2023   MCV 89.3 07/08/2023   PLT 206.0 07/08/2023   Lab Results  Component Value Date   HGBA1C 6.5 07/08/2023   Lab Results  Component Value Date   CHOL 167 07/08/2023   HDL 40.30 07/08/2023   LDLCALC 105 (H) 07/08/2023   LDLDIRECT 156.0 07/06/2022   TRIG 110.0 07/08/2023   CHOLHDL 4 07/08/2023   Lab Results  Component Value Date   TSH 1.77 05/12/2010     Assessment and Plan:  Essential hypertension Assessment & Plan: Endorses whitecoat HTN, a little anxious today due to first visit here. No change to pharmacotherapy at this time, but encouraged careful home BP monitoring. She is to let me know if her readings are consistently >140 systolic. Verbalizes understanding.    Bilateral carotid bruits Assessment & Plan: Patient agreeable to evaluate with bilateral carotid duplex ultrasound, but does not want to schedule this until September because she has a lot going on.  Submitting the order with scheduling instructions for Sept.   Orders: -     US  Carotid Bilateral; Future  Diet-controlled Type 2 diabetes mellitus with hyperlipidemia [facility]) Assessment & Plan: Congratulated on progress so far. Okay to continue without pharmacotherapy for this problem.   Recommend diet low in simple carbohydrates such as white  starches (bread, pasta, rice) and refined sugar found in desserts and sweetened beverages including juice, sweet tea, and soda.        Return in about 2 months (around 10/13/2023) for OV f/u chronic conditions.    Rolan Hoyle, PA-C, DMSc, Nutritionist [facility] [phone]

## 2023-08-12 NOTE — Patient Instructions (Addendum)
-  It was a pleasure to see you today! Please review your visit summary for helpful information -I would encourage you to follow your care via MyChart where you can access lab results, notes, messages, and more -If you feel that we did a nice job today, please complete your after-visit survey and leave us a Google review! Your CMA today was Kieandra and your provider was Dan Waddell, PA-C, DMSc -Please return for follow-up in about 2 months  

## 2023-10-14 ENCOUNTER — Encounter: Payer: Self-pay | Admitting: Physician Assistant

## 2023-10-14 ENCOUNTER — Ambulatory Visit (INDEPENDENT_AMBULATORY_CARE_PROVIDER_SITE_OTHER): Admitting: Physician Assistant

## 2023-10-14 VITALS — BP 130/72 | HR 61 | Temp 98.3°F | Ht 63.0 in

## 2023-10-14 DIAGNOSIS — E1169 Type 2 diabetes mellitus with other specified complication: Secondary | ICD-10-CM | POA: Diagnosis not present

## 2023-10-14 DIAGNOSIS — M810 Age-related osteoporosis without current pathological fracture: Secondary | ICD-10-CM | POA: Diagnosis not present

## 2023-10-14 DIAGNOSIS — R0989 Other specified symptoms and signs involving the circulatory and respiratory systems: Secondary | ICD-10-CM

## 2023-10-14 DIAGNOSIS — E785 Hyperlipidemia, unspecified: Secondary | ICD-10-CM | POA: Diagnosis not present

## 2023-10-14 DIAGNOSIS — I10 Essential (primary) hypertension: Secondary | ICD-10-CM

## 2023-10-14 LAB — POCT GLYCOSYLATED HEMOGLOBIN (HGB A1C): Hemoglobin A1C: 5.9 % — AB (ref 4.0–5.6)

## 2023-10-14 NOTE — Progress Notes (Signed)
 Date:  10/14/2023   Name:  Selena Beasley   DOB:  1950-03-23   MRN:  [id number]   Chief Complaint: Medical Management of Chronic Issues  HPI Selena Beasley is a delightful 73 year old female who returns for 59-month follow-up visit, mainly to recheck her A1c which was well-controlled the last time it was checked in June at 6.5% without medication.  I also ordered a carotid ultrasound due to bilateral bruit, but this has not yet been completed as the patient has been busy with personal matters.  Blood pressure remains well-controlled at home, typically 120s systolic.  She has osteoporosis, due for repeat DEXA scan but she declines.  She supplements with vitamin D  and a multivitamin.  She was offered Fosamax by her previous PCP but declined.   Medication list has been reviewed and updated.  Current Meds  Medication Sig   Cholecalciferol (VITAMIN D3 PO) Take by mouth.   loratadine  (CLARITIN ) 10 MG tablet Take 1 tablet (10 mg total) by mouth daily as needed for allergies.   Multiple Vitamin (MULTIVITAMIN) tablet Take 1 tablet by mouth daily.   potassium chloride  (KLOR-CON ) 10 MEQ tablet Take 1 tablet (10 mEq total) by mouth daily.   simvastatin  (ZOCOR ) 40 MG tablet Take 1 tablet (40 mg total) by mouth at bedtime.   triamterene -hydrochlorothiazide (MAXZIDE-25) 37.5-25 MG tablet Take 1 tablet by mouth daily.     Review of Systems  Patient Active Problem List   Diagnosis Date Noted   Diet-controlled Type 2 diabetes mellitus with hyperlipidemia (HCC) 07/12/2022   Osteoporosis 07/21/2020   Bilateral carotid bruits 07/03/2020   Vitamin D  deficiency 06/17/2016   Left shoulder pain 08/04/2015   Benign paroxysmal positional vertigo 06/05/2013   Pure hypercholesterolemia 05/12/2010   Essential hypertension 08/04/2006    Allergies  Allergen Reactions   Bacitracin     rash   Neomycin     rash   Polymyxin B Other (See Comments)    rash   Zostavax [Zoster Vaccine Live]     Would  avoid due to possible neomycin allergy.     Immunization History  Administered Date(s) Administered   Fluad Quad(high Dose 65+) 10/11/2018   Influenza Whole 12/04/2003   Influenza,inj,Quad PF,6+ Mos 11/12/2017   Influenza-Unspecified 11/22/2014   Pneumococcal Conjugate-13 10/11/2018   Pneumococcal Polysaccharide-23 06/27/2020   Td 08/14/2002   Td (Adult),5 Lf Tetanus Toxid, Preservative Free 10/19/2022   Tdap 06/03/2012    Past Surgical History:  Procedure Laterality Date   COLONOSCOPY WITH PROPOFOL  N/A 10/05/2019   Procedure: COLONOSCOPY WITH PROPOFOL ;  Surgeon: Jinny Carmine, MD;  Location: [facility];  Service: Endoscopy;  Laterality: N/A;   MOHS SURGERY     10/2020, right forehead   NSVD     x3 (Last 1995)    Social History   Tobacco Use   Smoking status: Former    Current packs/day: 0.00    Average packs/day: 0.3 packs/day for 2.0 years (0.5 ttl pk-yrs)    Types: Cigarettes    Start date: 57    Quit date: 58    Years since quitting: 48.7   Smokeless tobacco: Never   Tobacco comments:    5 cigs weekly  Vaping Use   Vaping status: Never Used  Substance Use Topics   Alcohol use: No   Drug use: No    Family History  Problem Relation Age of Onset   Hypertension Mother    Cancer Mother        breast  Breast cancer Mother    Heart attack Father 51       deceased from same   Heart disease Father    Alcohol abuse Father    Diabetes Brother    Stroke Son    Diabetes Maternal Grandmother    Diabetes Son    Colon cancer Neg Hx         10/14/2023   10:12 AM 08/12/2023    9:06 AM 07/08/2023    9:29 AM 07/06/2022   10:13 AM  GAD 7 : Generalized Anxiety Score  Nervous, Anxious, on Edge 0 0 0 0  Control/stop worrying 0 0 0 0  Worry too much - different things 0 0 0 0  Trouble relaxing 0 0 0 0  Restless 0 0 0 0  Easily annoyed or irritable 0 0 0 0  Afraid - awful might happen 0 0 0 0  Total GAD 7 Score 0 0 0 0  Anxiety Difficulty Not difficult at  all Not difficult at all Not difficult at all Not difficult at all       10/14/2023   10:12 AM 08/12/2023    9:06 AM 07/08/2023    9:29 AM  Depression screen PHQ 2/9  Decreased Interest 0 0 0  Down, Depressed, Hopeless 0 0 0  PHQ - 2 Score 0 0 0  Altered sleeping   0  Tired, decreased energy   0  Change in appetite   0  Feeling bad or failure about yourself    0  Trouble concentrating   0  Moving slowly or fidgety/restless   0  Suicidal thoughts   0  PHQ-9 Score   0  Difficult doing work/chores   Not difficult at all    BP Readings from Last 3 Encounters:  10/14/23 130/72  08/12/23 (!) 166/66  07/08/23 138/66    Wt Readings from Last 3 Encounters:  08/12/23 134 lb (60.8 kg)  07/08/23 137 lb (62.1 kg)  06/24/23 140 lb (63.5 kg)    BP 130/72   Pulse 61   Temp 98.3 F (36.8 C)   Ht 5' 3 (1.6 m)   SpO2 96%   BMI 23.74 kg/m   Physical Exam Vitals and nursing note reviewed.  Constitutional:      Appearance: Normal appearance.  Neck:     Vascular: Carotid bruit (bilateral, R>L) present.  Cardiovascular:     Rate and Rhythm: Normal rate and regular rhythm.     Heart sounds: No murmur heard.    No friction rub. No gallop.  Pulmonary:     Effort: Pulmonary effort is normal.     Breath sounds: Normal breath sounds.  Abdominal:     General: There is no distension.  Musculoskeletal:        General: Normal range of motion.  Skin:    General: Skin is warm and dry.  Neurological:     Mental Status: She is alert and oriented to person, place, and time.     Gait: Gait is intact.  Psychiatric:        Mood and Affect: Mood and affect normal.     Recent Labs     Component Value Date/Time   NA 142 07/08/2023 1022   K 3.7 07/08/2023 1022   CL 101 07/08/2023 1022   CO2 26 07/08/2023 1022   GLUCOSE 118 (H) 07/08/2023 1022   BUN 19 07/08/2023 1022   CREATININE 0.61 07/08/2023 1022   CALCIUM 9.8 07/08/2023 1022  CALCIUM 9.5 07/08/2023 1022   PROT 6.9 07/08/2023  1022   ALBUMIN 4.7 07/08/2023 1022   AST 15 07/08/2023 1022   ALT 11 07/08/2023 1022   ALKPHOS 47 07/08/2023 1022   BILITOT 0.5 07/08/2023 1022   GFRNONAA 109.77 05/02/2009 0911   GFRAA 96 03/30/2007 0934    Lab Results  Component Value Date   WBC 5.9 07/08/2023   HGB 14.9 07/08/2023   HCT 43.9 07/08/2023   MCV 89.3 07/08/2023   PLT 206.0 07/08/2023   Lab Results  Component Value Date   HGBA1C 5.9 (A) 10/14/2023   HGBA1C 6.5 07/08/2023   HGBA1C 7.9 (H) 07/06/2022   Lab Results  Component Value Date   CHOL 167 07/08/2023   HDL 40.30 07/08/2023   LDLCALC 105 (H) 07/08/2023   LDLDIRECT 156.0 07/06/2022   TRIG 110.0 07/08/2023   CHOLHDL 4 07/08/2023   Lab Results  Component Value Date   TSH 1.77 05/12/2010      Assessment and Plan:  Diet-controlled Type 2 diabetes mellitus with hyperlipidemia (HCC) Assessment & Plan: Excellent A1c of 5.9% today reflecting ongoing glycemic control even in the absence of medication.  Patient congratulated on this.  Orders: -     POCT glycosylated hemoglobin (Hb A1C)  Bilateral carotid bruits Assessment & Plan: Patient encouraged to complete her carotid ultrasound when convenient for her, though this is not urgent.  She was provided the contact number to schedule when ready.   Essential hypertension Assessment & Plan: Well-controlled with current regimen, continue.   Osteoporosis, unspecified osteoporosis type, unspecified pathological fracture presence Assessment & Plan: I encourage continued supplementation of calcium and vitamin D  either through an OTC supplement or women's multivitamin. The goal for TOTAL daily intake (from supplements and from food) is about 1200 mg of calcium and 800 IU of Vit D daily. I also recommend routine weight-bearing exercise to promote bone health, at least 3x per week for 20 min sessions. This could be something as simple as walking around the neighborhood, but may also include things like yoga,  dance, mat exercises, etc.       Return in about 6 months (around 04/12/2024) for 6 months fasting OV.    Rolan Hoyle, PA-C, DMSc, Nutritionist [facility] [phone]

## 2023-10-14 NOTE — Assessment & Plan Note (Signed)
Well controlled with current regimen, continue.

## 2023-10-14 NOTE — Assessment & Plan Note (Signed)
 Patient encouraged to complete her carotid ultrasound when convenient for her, though this is not urgent.  She was provided the contact number to schedule when ready.

## 2023-10-14 NOTE — Assessment & Plan Note (Signed)
 I encourage continued supplementation of calcium and vitamin D  either through an OTC supplement or women's multivitamin. The goal for TOTAL daily intake (from supplements and from food) is about 1200 mg of calcium and 800 IU of Vit D daily. I also recommend routine weight-bearing exercise to promote bone health, at least 3x per week for 20 min sessions. This could be something as simple as walking around the neighborhood, but may also include things like yoga, dance, mat exercises, etc.

## 2023-10-14 NOTE — Assessment & Plan Note (Signed)
 Excellent A1c of 5.9% today reflecting ongoing glycemic control even in the absence of medication.  Patient congratulated on this.

## 2023-11-03 DIAGNOSIS — H04123 Dry eye syndrome of bilateral lacrimal glands: Secondary | ICD-10-CM | POA: Diagnosis not present

## 2023-11-03 DIAGNOSIS — H0288B Meibomian gland dysfunction left eye, upper and lower eyelids: Secondary | ICD-10-CM | POA: Diagnosis not present

## 2023-11-03 DIAGNOSIS — H2513 Age-related nuclear cataract, bilateral: Secondary | ICD-10-CM | POA: Diagnosis not present

## 2023-11-03 DIAGNOSIS — H0288A Meibomian gland dysfunction right eye, upper and lower eyelids: Secondary | ICD-10-CM | POA: Diagnosis not present

## 2024-04-06 ENCOUNTER — Ambulatory Visit: Admitting: Physician Assistant

## 2024-04-12 ENCOUNTER — Ambulatory Visit: Admitting: Physician Assistant

## 2024-06-27 ENCOUNTER — Ambulatory Visit

## 2024-10-17 ENCOUNTER — Encounter: Admitting: Physician Assistant
